# Patient Record
Sex: Female | Born: 1980 | Hispanic: Yes | Marital: Single | Smoking: Never smoker
Health system: Southern US, Community
[De-identification: ages and names within clinical notes are randomized; demographics above are authoritative.]

## PROBLEM LIST (undated history)

## (undated) ENCOUNTER — Inpatient Hospital Stay: Payer: Self-pay

## (undated) DIAGNOSIS — F32A Depression, unspecified: Secondary | ICD-10-CM

## (undated) DIAGNOSIS — F329 Major depressive disorder, single episode, unspecified: Secondary | ICD-10-CM

## (undated) DIAGNOSIS — K8020 Calculus of gallbladder without cholecystitis without obstruction: Secondary | ICD-10-CM

## (undated) DIAGNOSIS — D649 Anemia, unspecified: Secondary | ICD-10-CM

## (undated) DIAGNOSIS — E669 Obesity, unspecified: Secondary | ICD-10-CM

## (undated) HISTORY — DX: Calculus of gallbladder without cholecystitis without obstruction: K80.20

## (undated) HISTORY — DX: Depression, unspecified: F32.A

## (undated) HISTORY — DX: Obesity, unspecified: E66.9

## (undated) HISTORY — DX: Major depressive disorder, single episode, unspecified: F32.9

---

## 2006-01-11 ENCOUNTER — Ambulatory Visit: Payer: Self-pay | Admitting: Family Medicine

## 2006-02-14 ENCOUNTER — Ambulatory Visit: Payer: Self-pay | Admitting: Family Medicine

## 2006-05-29 ENCOUNTER — Ambulatory Visit: Payer: Self-pay | Admitting: Family Medicine

## 2006-07-20 ENCOUNTER — Inpatient Hospital Stay: Payer: Self-pay

## 2010-03-07 ENCOUNTER — Emergency Department: Payer: Self-pay | Admitting: Emergency Medicine

## 2011-03-15 ENCOUNTER — Ambulatory Visit: Payer: Self-pay | Admitting: Advanced Practice Midwife

## 2011-08-20 ENCOUNTER — Inpatient Hospital Stay: Payer: Self-pay | Admitting: Obstetrics and Gynecology

## 2011-09-14 ENCOUNTER — Emergency Department: Payer: Self-pay | Admitting: Emergency Medicine

## 2014-12-19 NOTE — L&D Delivery Note (Signed)
Delivery Note At  a viable female was delivered via  SVD  (Presentation  roa : ;  ).  APGAR:9/9  , ; weight  .  7/14 Placenta status delayed cord clamping and spontaneous placenta delivery : , .     complications: None .   Anesthesia: Epidural  Episiotomy: None Lacerations: None Suture Repair: n/a Est. Blood Loss (mL):  150 cc  Mom to postpartum.  Baby to Couplet care / Skin to Skin  MBT O neg .  Gurshan Settlemire 08/21/2015, 10:19 PM

## 2015-03-19 ENCOUNTER — Ambulatory Visit
Admit: 2015-03-19 | Disposition: A | Discharge status: Home / Self Care | Payer: Self-pay | Attending: Primary Care | Admitting: Primary Care

## 2015-07-08 ENCOUNTER — Observation Stay
Admission: EM | Admit: 2015-07-08 | Discharge: 2015-07-08 | Disposition: A | Discharge status: Home / Self Care | Payer: Self-pay | Attending: Obstetrics and Gynecology | Admitting: Obstetrics and Gynecology

## 2015-07-08 DIAGNOSIS — R319 Hematuria, unspecified: Principal | ICD-10-CM | POA: Insufficient documentation

## 2015-07-08 DIAGNOSIS — Z3A34 34 weeks gestation of pregnancy: Secondary | ICD-10-CM | POA: Insufficient documentation

## 2015-07-08 LAB — URINALYSIS COMPLETE WITH MICROSCOPIC (ARMC ONLY)
Bilirubin Urine: NEGATIVE
GLUCOSE, UA: NEGATIVE mg/dL
Ketones, ur: NEGATIVE mg/dL
Leukocytes, UA: NEGATIVE
NITRITE: NEGATIVE
Protein, ur: 100 mg/dL — AB
Specific Gravity, Urine: 1.004 — ABNORMAL LOW (ref 1.005–1.030)
pH: 7 (ref 5.0–8.0)

## 2015-07-08 LAB — CBC
HCT: 33.4 % — ABNORMAL LOW (ref 35.0–47.0)
HEMOGLOBIN: 11.6 g/dL — AB (ref 12.0–16.0)
MCH: 30.9 pg (ref 26.0–34.0)
MCHC: 34.7 g/dL (ref 32.0–36.0)
MCV: 89.1 fL (ref 80.0–100.0)
PLATELETS: 188 10*3/uL (ref 150–440)
RBC: 3.75 MIL/uL — AB (ref 3.80–5.20)
RDW: 14.1 % (ref 11.5–14.5)
WBC: 8 10*3/uL (ref 3.6–11.0)

## 2015-07-08 NOTE — Discharge Instructions (Signed)
Hematuria (Hematuria) La hematuria es la presencia de sangre en la orina. La causa puede ser una infeccin en la vejiga, en los riones, en la prstata, clculos renales o cncer de las vas urinarias. Normalmente las infecciones pueden tratarse con medicamentos, y los clculos renales, por lo general, se eliminarn a travs de la orina. Si ninguna de estas es la causa de su hematuria, quizs se necesiten ms estudios para Building services engineerdescubrir el motivo. Es muy importante que le informe a su mdico si ve sangre en la [place], aunque el sangrado se detenga sin tratamiento o no cause dolor. La sangre que aparece en la orina y luego se detiene y vuelve a aparecer nuevamente puede ser un sntoma de una enfermedad muy grave. Adems, el dolor no es un sntoma en las etapas iniciales de muchos tipos de cncer urinarios. INSTRUCCIONES PARA EL CUIDADO EN EL HOGAR   Beba mucho lquido, entre 3 a Risk analyst4litros por da. Si le han diagnosticado una infeccin, se recomienda especialmente el jugo de arndanos rojos, [address] de grandes cantidades de [place].  Evite la cafena, el t y las bebidas con gas porque suelen irritar la vejiga.  Evite el alcohol ya que puede Teacher, adult educationirritar la prstata.  Tome todos los Mons[employer] Companymedicamentos como se lo haya indicado el mdico.  Si le recetaron antibiticos, asegrese de terminarlos, incluso si comienza a sentirse mejor.  Si le han diagnosticado clculos renales, siga las instrucciones de su mdico con respecto a Ecologistfiltrar la orina para TEFL teacherrescatar el clculo.  Vace la vejiga con frecuencia. Evite retener la orina durante largos perodos.  Despus de defecar, las mujeres deben higienizarse desde adelante hacia atrs. Use solo un papel por vez.  Si es AmerisourceBergen Corporationuna mujer, vace la vejiga antes y despus de Management consultanttener relaciones sexuales. SOLICITE ATENCIN MDICA SI:  Siente dolor en la espalda.  Tiene fiebre.  Tiene sensacin de Programme researcher, broadcasting/film/videomalestar estomacal (nuseas) o vmitos.  Los sntomas no mejoran despus de [address] das. Si la  condicin empeora, visite al mdico antes de lo previsto. SOLICITE ATENCIN MDICA DE INMEDIATO SI:   Vomita con frecuencia e intensamente y no puede tolerar los medicamentos.  Siente un dolor intenso en la espalda o el abdomen incluso tomando los medicamentos.  Elimina cogulos grandes o sangre con la [place].  Se siente extremadamente dbil, se desmaya o pierde el conocimiento. ASEGRESE DE QUE:   Comprende estas instrucciones.  Controlar su afeccin.  Recibir ayuda de inmediato si no mejora o si empeora. Document Released: 12/05/2005 Document Revised: 04/21/2014 [facility] Patient Information 2015 [employer] [place]. This information is not intended to replace advice given to you by your health care provider. Make sure you discuss any questions you have with your health care provider.

## 2015-07-09 NOTE — Progress Notes (Signed)
S: Resting comfortably. no CTX, VB. Active fetal movement. Concerned about hematuria noted at [facility] today.   O: LMP 11/09/2014 (Exact Date)   Gen: NAD, AAOx3      Abd: FNTTP      Ext: Non-tender, Nonedmeatous    FHT: 130s mod var + accelerations no decelerations TOCO: quiet SVE: closed   A/P:  34 y.o. [id number] [redacted]w[redacted]d with hematuria  Labor: not present.   CBC wnl  Urine culture sent  FWB: Reassuring Cat 1 tracing.  D/c home stable, precautions reviewed, follow-up as scheduled.    Zakar Brosch MICHAEL 6:42 AM

## 2015-08-21 ENCOUNTER — Encounter: Payer: Self-pay | Admitting: *Deleted

## 2015-08-21 ENCOUNTER — Inpatient Hospital Stay: Payer: Medicaid Other | Admitting: Anesthesiology

## 2015-08-21 ENCOUNTER — Inpatient Hospital Stay
Admission: EM | Admit: 2015-08-21 | Discharge: 2015-08-23 | DRG: 767 | Disposition: A | Discharge status: Home / Self Care | Payer: Medicaid Other | Attending: Obstetrics and Gynecology | Admitting: Obstetrics and Gynecology

## 2015-08-21 DIAGNOSIS — Z302 Encounter for sterilization: Secondary | ICD-10-CM

## 2015-08-21 DIAGNOSIS — Z3A40 40 weeks gestation of pregnancy: Secondary | ICD-10-CM | POA: Diagnosis not present

## 2015-08-21 DIAGNOSIS — O480 Post-term pregnancy: Principal | ICD-10-CM | POA: Diagnosis present

## 2015-08-21 LAB — TYPE AND SCREEN
ABO/RH(D): O NEG
ANTIBODY SCREEN: POSITIVE

## 2015-08-21 LAB — CBC
HCT: 37.7 % (ref 35.0–47.0)
Hemoglobin: 12.9 g/dL (ref 12.0–16.0)
MCH: 31.1 pg (ref 26.0–34.0)
MCHC: 34.3 g/dL (ref 32.0–36.0)
MCV: 90.7 fL (ref 80.0–100.0)
PLATELETS: 179 10*3/uL (ref 150–440)
RBC: 4.16 MIL/uL (ref 3.80–5.20)
RDW: 14.3 % (ref 11.5–14.5)
WBC: 8 10*3/uL (ref 3.6–11.0)

## 2015-08-21 LAB — ABO/RH: ABO/RH(D): O NEG

## 2015-08-21 MED ORDER — DINOPROSTONE 10 MG VA INST
10.0000 mg | VAGINAL_INSERT | Freq: Once | VAGINAL | Status: DC
  Filled 2015-08-21: qty 1

## 2015-08-21 MED ORDER — OXYTOCIN 40 UNITS IN LACTATED RINGERS INFUSION - SIMPLE MED
62.5000 mL/h | INTRAVENOUS | Status: DC
  Administered 2015-08-21: 62.5 mL/h via INTRAVENOUS

## 2015-08-21 MED ORDER — OXYCODONE-ACETAMINOPHEN 5-325 MG PO TABS
1.0000 | ORAL_TABLET | ORAL | Status: DC | PRN
  Filled 2015-08-21 (×3): qty 1

## 2015-08-21 MED ORDER — MISOPROSTOL 25 MCG QUARTER TABLET
ORAL_TABLET | ORAL | Status: AC
  Administered 2015-08-21: 25 ug via VAGINAL
  Filled 2015-08-21: qty 0.25

## 2015-08-21 MED ORDER — OXYCODONE-ACETAMINOPHEN 5-325 MG PO TABS
2.0000 | ORAL_TABLET | ORAL | Status: DC | PRN
  Administered 2015-08-22 – 2015-08-23 (×5): 2 via ORAL
  Filled 2015-08-21 (×5): qty 2

## 2015-08-21 MED ORDER — CITRIC ACID-SODIUM CITRATE 334-500 MG/5ML PO SOLN: 30.0000 mL | ORAL | Status: DC | PRN

## 2015-08-21 MED ORDER — BUPIVACAINE HCL (PF) 0.25 % IJ SOLN
INTRAMUSCULAR | Status: DC | PRN
  Administered 2015-08-21: 5 mL

## 2015-08-21 MED ORDER — MISOPROSTOL 25 MCG QUARTER TABLET
25.0000 ug | ORAL_TABLET | ORAL | Status: DC
  Administered 2015-08-21 (×4): 25 ug via VAGINAL
  Filled 2015-08-21 (×2): qty 0.25

## 2015-08-21 MED ORDER — LIDOCAINE-EPINEPHRINE (PF) 1.5 %-1:200000 IJ SOLN
INTRAMUSCULAR | Status: DC | PRN
  Administered 2015-08-21: 2 mL via PERINEURAL

## 2015-08-21 MED ORDER — ONDANSETRON HCL 4 MG/2ML IJ SOLN: 4.0000 mg | Freq: Four times a day (QID) | INTRAMUSCULAR | Status: DC | PRN

## 2015-08-21 MED ORDER — FENTANYL 2.5 MCG/ML W/ROPIVACAINE 0.2% IN NS 100 ML EPIDURAL INFUSION (ARMC-ANES)
EPIDURAL | Status: AC
  Administered 2015-08-21: 9 mL/h via EPIDURAL
  Filled 2015-08-21: qty 100

## 2015-08-21 MED ORDER — ACETAMINOPHEN 325 MG PO TABS: 650.0000 mg | ORAL_TABLET | ORAL | Status: DC | PRN

## 2015-08-21 MED ORDER — LIDOCAINE HCL (PF) 1 % IJ SOLN: 30.0000 mL | INTRAMUSCULAR | Status: DC | PRN

## 2015-08-21 MED ORDER — FLEET ENEMA 7-19 GM/118ML RE ENEM: 1.0000 | ENEMA | Freq: Every day | RECTAL | Status: DC | PRN

## 2015-08-21 MED ORDER — OXYTOCIN BOLUS FROM INFUSION: 500.0000 mL | INTRAVENOUS | Status: DC

## 2015-08-21 MED ORDER — LACTATED RINGERS IV SOLN: 500.0000 mL | INTRAVENOUS | Status: DC | PRN

## 2015-08-21 MED ORDER — TERBUTALINE SULFATE 1 MG/ML IJ SOLN: 0.2500 mg | Freq: Once | INTRAMUSCULAR | Status: DC | PRN

## 2015-08-21 MED ORDER — FENTANYL CITRATE (PF) 1000 MCG/20ML IJ SOLN: 20.0000 ug/h | INTRAMUSCULAR | Status: DC

## 2015-08-21 MED ORDER — LACTATED RINGERS IV SOLN
INTRAVENOUS | Status: DC
  Administered 2015-08-21 (×2): via INTRAVENOUS
  Administered 2015-08-22: 1000 mL via INTRAVENOUS

## 2015-08-21 NOTE — Anesthesia Procedure Notes (Signed)
Epidural Patient location during procedure: OB Start time: 08/21/2015 8:24 PM End time: 08/21/2015 8:48 PM  Staffing Anesthesiologist: Elijio Miles F Performed by: anesthesiologist   Preanesthetic Checklist Completed: patient identified, site marked, surgical consent, pre-op evaluation, timeout performed, IV checked, risks and benefits discussed and monitors and equipment checked  Epidural Patient position: sitting Prep: Betadine Patient monitoring: heart rate and blood pressure Approach: midline Location: L3-L4 Injection technique: LOR air and LOR saline  Needle:  Needle type: Tuohy  Needle gauge: 18 G Needle length: 9 cm Needle insertion depth: 5 cm Catheter type: closed end flexible Catheter size: 20 Guage Catheter at skin depth: 9 cm Test dose: negative and 2% lidocaine with Epi 1:200 K  Assessment Sensory level: T8

## 2015-08-21 NOTE — H&P (Signed)
Tammy Herrera is a 34 y.o. female G4P3 presenting for IOL at 40+5weeks for postdates.  Denies HA, visual disturbances, epigastric pain, VB, LOF, ctxs, abnormal DC Maternal Medical History:  Fetal activity: Perceived fetal activity is normal.   Last perceived fetal movement was within the past hour.    Prenatal complications: no prenatal complications Prenatal Complications - Diabetes: none.    OB History    Gravida Para Term Preterm AB TAB SAB Ectopic Multiple Living   History reviewed. Hx: seasonal allergies, obesity, RH negative  History reviewed. No pertinent past surgical history. Family History: family history is not on file. Social History:  reports that she has never smoked. She does not have any smokeless tobacco history on file. She reports that she does not drink alcohol or use illicit drugs.   Prenatal Transfer Tool  Maternal Diabetes: No Genetic Screening: Declined Maternal Ultrasounds/Referrals: Normal Fetal Ultrasounds or other Referrals:  None Maternal Substance Abuse:  No Significant Maternal Medications:  None Significant Maternal Lab Results:  None Other Comments:  None  Review of Systems  Constitutional: Negative for fever, chills and weight loss.  HENT: Negative.   Eyes: Negative for blurred vision and photophobia.  Respiratory: Negative for cough, shortness of breath and wheezing.   Cardiovascular: Negative for chest pain and palpitations.  Gastrointestinal: Negative for heartburn, vomiting, abdominal pain and diarrhea.  Genitourinary: Negative for dysuria, urgency and frequency.  Musculoskeletal: Negative.   Skin: Negative.   Neurological: Negative for dizziness and seizures.  Psychiatric/Behavioral: The patient is nervous/anxious.     Dilation: Fingertip Effacement (%): 20 Station: -3 Exam by:: Tammy Herrera, CNM Last menstrual period 11/09/2014. Maternal Exam:  Abdomen: Patient reports no abdominal tenderness. Fetal  presentation: vertex  Introitus: Normal vulva. Normal vagina.  Pelvis: adequate for delivery.   Cervix: Cervix evaluated by digital exam.     Physical Exam  Constitutional: She is oriented to person, place, and time. She appears well-developed and well-nourished.  HENT:  Head: Normocephalic.  Eyes: Pupils are equal, round, and reactive to light.  Cardiovascular: Normal rate, regular rhythm and normal heart sounds.   Respiratory: Breath sounds normal.  GI: Soft. Bowel sounds are normal. There is no CVA tenderness.  Genitourinary: Vagina normal and uterus normal.  Gravid  Neurological: She is alert and oriented to person, place, and time.  Skin: Skin is dry.  Psychiatric: She has a normal mood and affect. Her behavior is normal. Judgment and thought content normal.  Bishop Score: 2  Fetal Monitoring: Baseline:  135bmp; Moderate variability, +accels, no decels Toco: irregular ctxs.  Prenatal labs: ABO, Rh:  Negative  Antibody:  Negative Rubella:  Immune RPR:   NR HBsAg:   Negative HIV:   NR GBS:   Negative  Assessment/Plan: IOL at 40+5weeks for postdates RH Negative - will need Rhogam PP Cervadil  vaginally x 1 Bilateral Tubal Ligation Postpartum - consent signed Reassess in 1-2 hours Category 1 fetal heart tracing  Anticipate NSVD  Discussed plan of care with Dr. Feliberto Gottron and he agrees.   Karena Addison CNM 08/21/2015, 8:12 AM

## 2015-08-21 NOTE — Progress Notes (Signed)
Patient ID: Tammy Herrera, female   DOB: 08-19-1981, 34 y.o.   MRN: [id number] cx 5 cm . Ctx q2 minutes . Reassuring fetal monitoring . ClE in place .

## 2015-08-21 NOTE — Progress Notes (Signed)
Patient ID: Tammy Herrera, female   DOB: [birth year], 34 y.o.   MRN: [id number] Pt is s/p 3 doses of cytotec .  Reassuring fetal monitoring  Ctx 5-7 minutes  Cx : 2 cm / 50% / -1 VTX . Some old blood  A: induction with cervical progression  P: repeat cytotec at 1700

## 2015-08-21 NOTE — Anesthesia Preprocedure Evaluation (Signed)
Anesthesia Evaluation  Patient identified by MRN, date of birth, ID band Patient awake    Reviewed: Allergy & Precautions, NPO status , Patient's Chart, lab work & pertinent test results  Airway Mallampati: II       Dental no notable dental hx.    Pulmonary neg pulmonary ROS,    Pulmonary exam normal       Cardiovascular negative cardio ROS Normal cardiovascular exam    Neuro/Psych negative neurological ROS     GI/Hepatic negative GI ROS, Neg liver ROS,   Endo/Other  negative endocrine ROS  Renal/GU negative Renal ROS     Musculoskeletal negative musculoskeletal ROS (+)   Abdominal Normal abdominal exam  (+)   Peds  Hematology negative hematology ROS (+)   Anesthesia Other Findings   Reproductive/Obstetrics negative OB ROS                             Anesthesia Physical Anesthesia Plan  ASA: II  Anesthesia Plan: Epidural   Post-op Pain Management:    Induction:   Airway Management Planned: Natural Airway  Additional Equipment:   Intra-op Plan:   Post-operative Plan:   Informed Consent: I have reviewed the patients History and Physical, chart, labs and discussed the procedure including the risks, benefits and alternatives for the proposed anesthesia with the patient or authorized representative who has indicated his/her understanding and acceptance.     Plan Discussed with:   Anesthesia Plan Comments:         Anesthesia Quick Evaluation

## 2015-08-21 NOTE — Progress Notes (Signed)
Patient ID: Tammy Herrera, female   DOB: 08/11/1981, 34 y.o.   MRN: [id number] 25 mcg cytotec placed . CX 1-2 cm / 50 /-3 VTX  Translator present  All questions answered

## 2015-08-21 NOTE — Progress Notes (Signed)
Patient ID: Tammy Herrera, female   DOB: [birth year], 34 y.o.   MRN: [id number] Induction from [facility] HD for "postdates" 40+5 weeks. GBS neg . Assuming care . Will plce cytotec shortly  Reassuring fetal monitoring

## 2015-08-21 NOTE — Progress Notes (Signed)
Patient ID: Tammy Herrera, female   DOB: 1981/10/11, 34 y.o.   MRN: [id number] cx 3 cm , arom blood tinged .  IUPC and fse placed  Reassuring fetal monitoring  Anticipate svd .

## 2015-08-22 ENCOUNTER — Inpatient Hospital Stay: Payer: Medicaid Other | Admitting: Registered Nurse

## 2015-08-22 ENCOUNTER — Encounter: Payer: Self-pay | Admitting: Anesthesiology

## 2015-08-22 ENCOUNTER — Encounter
Admission: EM | Disposition: A | Discharge status: Home / Self Care | Payer: Self-pay | Source: Home / Self Care | Attending: Obstetrics and Gynecology

## 2015-08-22 HISTORY — PX: TUBAL LIGATION: SHX77

## 2015-08-22 LAB — CBC
HEMATOCRIT: 34.2 % — AB (ref 35.0–47.0)
Hemoglobin: 12.3 g/dL (ref 12.0–16.0)
MCH: 32.4 pg (ref 26.0–34.0)
MCHC: 36 g/dL (ref 32.0–36.0)
MCV: 90 fL (ref 80.0–100.0)
Platelets: 178 10*3/uL (ref 150–440)
RBC: 3.8 MIL/uL (ref 3.80–5.20)
RDW: 14.2 % (ref 11.5–14.5)
WBC: 10.6 10*3/uL (ref 3.6–11.0)

## 2015-08-22 LAB — RPR: RPR: NONREACTIVE

## 2015-08-22 SURGERY — LIGATION, FALLOPIAN TUBE, POSTPARTUM
Anesthesia: General | Laterality: Bilateral

## 2015-08-22 MED ORDER — PROPOFOL 10 MG/ML IV BOLUS
INTRAVENOUS | Status: DC | PRN
  Administered 2015-08-22: 200 mg via INTRAVENOUS

## 2015-08-22 MED ORDER — BUPIVACAINE HCL 0.5 % IJ SOLN
INTRAMUSCULAR | Status: DC | PRN
Start: 2015-08-22 — End: 2015-08-22
  Administered 2015-08-22: 2 mL

## 2015-08-22 MED ORDER — FENTANYL CITRATE (PF) 100 MCG/2ML IJ SOLN
25.0000 ug | INTRAMUSCULAR | Status: DC | PRN
  Administered 2015-08-22 (×4): 25 ug via INTRAVENOUS

## 2015-08-22 MED ORDER — BUPIVACAINE HCL (PF) 0.5 % IJ SOLN
INTRAMUSCULAR | Status: AC
  Filled 2015-08-22: qty 30

## 2015-08-22 MED ORDER — IBUPROFEN 600 MG PO TABS
600.0000 mg | ORAL_TABLET | Freq: Four times a day (QID) | ORAL | Status: DC
  Administered 2015-08-22 – 2015-08-23 (×4): 600 mg via ORAL
  Filled 2015-08-22 (×6): qty 1

## 2015-08-22 MED ORDER — NEOSTIGMINE METHYLSULFATE 10 MG/10ML IV SOLN
INTRAVENOUS | Status: DC | PRN
  Administered 2015-08-22: 3 mg via INTRAVENOUS

## 2015-08-22 MED ORDER — ROCURONIUM BROMIDE 100 MG/10ML IV SOLN
INTRAVENOUS | Status: DC | PRN
  Administered 2015-08-22: 20 mg via INTRAVENOUS

## 2015-08-22 MED ORDER — OXYTOCIN 40 UNITS IN LACTATED RINGERS INFUSION - SIMPLE MED: 62.5000 mL/h | INTRAVENOUS | Status: DC | PRN

## 2015-08-22 MED ORDER — SENNOSIDES-DOCUSATE SODIUM 8.6-50 MG PO TABS
2.0000 | ORAL_TABLET | ORAL | Status: DC
  Administered 2015-08-22 – 2015-08-23 (×2): 2 via ORAL
  Filled 2015-08-22 (×2): qty 2

## 2015-08-22 MED ORDER — DIPHENHYDRAMINE HCL 25 MG PO CAPS: 25.0000 mg | ORAL_CAPSULE | Freq: Four times a day (QID) | ORAL | Status: DC | PRN

## 2015-08-22 MED ORDER — ONDANSETRON HCL 4 MG PO TABS: 4.0000 mg | ORAL_TABLET | ORAL | Status: DC | PRN

## 2015-08-22 MED ORDER — LIDOCAINE HCL (CARDIAC) 20 MG/ML IV SOLN
INTRAVENOUS | Status: DC | PRN
  Administered 2015-08-22: 100 mg via INTRAVENOUS

## 2015-08-22 MED ORDER — LANOLIN HYDROUS EX OINT: TOPICAL_OINTMENT | CUTANEOUS | Status: DC | PRN

## 2015-08-22 MED ORDER — PRENATAL MULTIVITAMIN CH
1.0000 | ORAL_TABLET | Freq: Every day | ORAL | Status: DC
  Administered 2015-08-22 – 2015-08-23 (×2): 1 via ORAL
  Filled 2015-08-22 (×2): qty 1

## 2015-08-22 MED ORDER — OXYCODONE-ACETAMINOPHEN 5-325 MG PO TABS
1.0000 | ORAL_TABLET | ORAL | Status: DC | PRN
  Administered 2015-08-22 (×2): 1 via ORAL

## 2015-08-22 MED ORDER — LACTATED RINGERS IV SOLN
INTRAVENOUS | Status: DC | PRN
  Administered 2015-08-22: 09:00:00 via INTRAVENOUS

## 2015-08-22 MED ORDER — SUCCINYLCHOLINE CHLORIDE 20 MG/ML IJ SOLN
INTRAMUSCULAR | Status: DC | PRN
  Administered 2015-08-22: 100 mg via INTRAVENOUS

## 2015-08-22 MED ORDER — FERROUS SULFATE 325 (65 FE) MG PO TABS
325.0000 mg | ORAL_TABLET | Freq: Two times a day (BID) | ORAL | Status: DC
  Administered 2015-08-22 – 2015-08-23 (×2): 325 mg via ORAL
  Filled 2015-08-22 (×2): qty 1

## 2015-08-22 MED ORDER — ACETAMINOPHEN 325 MG PO TABS: 650.0000 mg | ORAL_TABLET | ORAL | Status: DC | PRN

## 2015-08-22 MED ORDER — ZOLPIDEM TARTRATE 5 MG PO TABS: 5.0000 mg | ORAL_TABLET | Freq: Every evening | ORAL | Status: DC | PRN

## 2015-08-22 MED ORDER — BENZOCAINE-MENTHOL 20-0.5 % EX AERO: 1.0000 "application " | INHALATION_SPRAY | CUTANEOUS | Status: DC | PRN

## 2015-08-22 MED ORDER — ONDANSETRON HCL 4 MG/2ML IJ SOLN: 4.0000 mg | INTRAMUSCULAR | Status: DC | PRN

## 2015-08-22 MED ORDER — RHO D IMMUNE GLOBULIN 1500 UNIT/2ML IJ SOSY
300.0000 ug | PREFILLED_SYRINGE | Freq: Once | INTRAMUSCULAR | Status: DC
  Filled 2015-08-22: qty 2

## 2015-08-22 MED ORDER — MAGNESIUM HYDROXIDE 400 MG/5ML PO SUSP: 30.0000 mL | ORAL | Status: DC | PRN

## 2015-08-22 MED ORDER — ONDANSETRON HCL 4 MG/2ML IJ SOLN
INTRAMUSCULAR | Status: DC | PRN
  Administered 2015-08-22: 4 mg via INTRAVENOUS

## 2015-08-22 MED ORDER — MEASLES, MUMPS & RUBELLA VAC ~~LOC~~ INJ
0.5000 mL | INJECTION | Freq: Once | SUBCUTANEOUS | Status: AC
  Administered 2015-08-23: 0.5 mL via SUBCUTANEOUS
  Filled 2015-08-22: qty 0.5

## 2015-08-22 MED ORDER — WITCH HAZEL-GLYCERIN EX PADS: 1.0000 "application " | MEDICATED_PAD | CUTANEOUS | Status: DC | PRN

## 2015-08-22 MED ORDER — DIBUCAINE 1 % RE OINT: 1.0000 "application " | TOPICAL_OINTMENT | RECTAL | Status: DC | PRN

## 2015-08-22 MED ORDER — GLYCOPYRROLATE 0.2 MG/ML IJ SOLN
INTRAMUSCULAR | Status: DC | PRN
  Administered 2015-08-22: 0.6 mg via INTRAVENOUS

## 2015-08-22 MED ORDER — SIMETHICONE 80 MG PO CHEW: 80.0000 mg | CHEWABLE_TABLET | ORAL | Status: DC | PRN

## 2015-08-22 MED ORDER — PROMETHAZINE HCL 25 MG/ML IJ SOLN: 6.2500 mg | INTRAMUSCULAR | Status: DC | PRN

## 2015-08-22 MED ORDER — FENTANYL CITRATE (PF) 100 MCG/2ML IJ SOLN
INTRAMUSCULAR | Status: AC
  Filled 2015-08-22: qty 2

## 2015-08-22 SURGICAL SUPPLY — 28 items
APPLICATOR COTTON TIP 6IN STRL (MISCELLANEOUS) IMPLANT
BLADE SURG SZ11 CARB STEEL (BLADE) ×2 IMPLANT
CANISTER SUCT 1200ML W/VALVE (MISCELLANEOUS) ×2 IMPLANT
CHLORAPREP W/TINT 26ML (MISCELLANEOUS) ×2 IMPLANT
DRAPE LAPAROTOMY 100X77 ABD (DRAPES) ×2 IMPLANT
DRSG TEGADERM 2-3/8X2-3/4 SM (GAUZE/BANDAGES/DRESSINGS) IMPLANT
DRSG TEGADERM 4X4.75 (GAUZE/BANDAGES/DRESSINGS) ×2 IMPLANT
GAUZE SPONGE NON-WVN 2X2 STRL (MISCELLANEOUS) ×1 IMPLANT
GLOVE BIO SURGEON STRL SZ8 (GLOVE) ×6 IMPLANT
GOWN STRL REUS W/ TWL LRG LVL3 (GOWN DISPOSABLE) ×1 IMPLANT
GOWN STRL REUS W/ TWL XL LVL3 (GOWN DISPOSABLE) ×1 IMPLANT
GOWN STRL REUS W/TWL LRG LVL3 (GOWN DISPOSABLE) ×1
GOWN STRL REUS W/TWL XL LVL3 (GOWN DISPOSABLE) ×1
KIT RM TURNOVER STRD PROC AR (KITS) ×2 IMPLANT
LABEL OR SOLS (LABEL) ×2 IMPLANT
LIQUID BAND (GAUZE/BANDAGES/DRESSINGS) ×2 IMPLANT
NEEDLE HYPO 25X1 1.5 SAFETY (NEEDLE) ×2 IMPLANT
NS IRRIG 500ML POUR BTL (IV SOLUTION) ×2 IMPLANT
PACK BASIN MINOR ARMC (MISCELLANEOUS) ×2 IMPLANT
PAD GROUND ADULT SPLIT (MISCELLANEOUS) ×2 IMPLANT
SPONGE VERSALON 2X2 STRL (MISCELLANEOUS) ×1
STRIP CLOSURE SKIN 1/4X4 (GAUZE/BANDAGES/DRESSINGS) ×2 IMPLANT
SUT PLAIN GUT 0 (SUTURE) ×2 IMPLANT
SUT VIC AB 2-0 UR6 27 (SUTURE) ×2 IMPLANT
SUT VIC AB 4-0 SH 27 (SUTURE) ×1
SUT VIC AB 4-0 SH 27XANBCTRL (SUTURE) ×1 IMPLANT
SWABSTK COMLB BENZOIN TINCTURE (MISCELLANEOUS) ×2 IMPLANT
SYRINGE 10CC LL (SYRINGE) ×2 IMPLANT

## 2015-08-22 NOTE — Anesthesia Postprocedure Evaluation (Signed)
  Anesthesia Post-op Note  Patient: Tammy Herrera  Procedure(s) Performed: Procedure(s): POST PARTUM TUBAL LIGATION (Bilateral)  Anesthesia type:General, Rapid Sequence  Patient location: PACU  Post pain: Pain level controlled  Post assessment: Post-op Vital signs reviewed, Patient's Cardiovascular Status Stable, Respiratory Function Stable, Patent Airway and No signs of Nausea or vomiting  Post vital signs: Reviewed and stable  Last Vitals:  Filed Vitals:   08/22/15 1113  BP: 101/58  Pulse: 54  Temp: 36.6 C  Resp: 18    Level of consciousness: awake, alert  and patient cooperative  Complications: No apparent anesthesia complications

## 2015-08-22 NOTE — Anesthesia Procedure Notes (Signed)
Procedure Name: Intubation Date/Time: 08/22/2015 9:08 AM Performed by: NFAOZHY, Amarilys Lyles Pre-anesthesia Checklist: Timeout performed, Patient being monitored, Suction available, Emergency Drugs available and Patient identified Patient Re-evaluated:Patient Re-evaluated prior to inductionOxygen Delivery Method: Circle system utilized Intubation Type: Rapid sequence and IV induction Laryngoscope Size: Mac and 3 Tube type: Oral Number of attempts: 1 Secured at: 21 cm Tube secured with: Tape

## 2015-08-22 NOTE — Anesthesia Preprocedure Evaluation (Signed)
Anesthesia Evaluation  Patient identified by MRN, date of birth, ID band Patient awake    Reviewed: Allergy & Precautions, H&P , NPO status , Patient's Chart, lab work & pertinent test results, reviewed documented beta blocker date and time   History of Anesthesia Complications Negative for: history of anesthetic complications  Airway Mallampati: II  TM Distance: >3 FB Neck ROM: full    Dental no notable dental hx. (+) Teeth Intact   Pulmonary neg shortness of breath, neg sleep apnea, neg COPDneg recent URI, former smoker,  breath sounds clear to auscultation  Pulmonary exam normal       Cardiovascular Exercise Tolerance: Good negative cardio ROS Normal cardiovascular examRhythm:regular Rate:Normal     Neuro/Psych negative neurological ROS  negative psych ROS   GI/Hepatic Neg liver ROS, GERD-  ,  Endo/Other  negative endocrine ROS  Renal/GU negative Renal ROS  negative genitourinary   Musculoskeletal   Abdominal   Peds  Hematology negative hematology ROS (+)   Anesthesia Other Findings History reviewed. No pertinent past medical history.   Reproductive/Obstetrics (+) Breast feeding                              Anesthesia Physical Anesthesia Plan  ASA: II  Anesthesia Plan: General and Rapid Sequence   Post-op Pain Management:    Induction:   Airway Management Planned:   Additional Equipment:   Intra-op Plan:   Post-operative Plan:   Informed Consent: I have reviewed the patients History and Physical, chart, labs and discussed the procedure including the risks, benefits and alternatives for the proposed anesthesia with the patient or authorized representative who has indicated his/her understanding and acceptance.   Dental Advisory Given  Plan Discussed with: Anesthesiologist, CRNA and Surgeon  Anesthesia Plan Comments:         Anesthesia Quick Evaluation

## 2015-08-22 NOTE — Anesthesia Postprocedure Evaluation (Signed)
  Anesthesia Post-op Note  Patient: Tammy Herrera  Procedure(s) Performed: * No procedures listed *  Anesthesia type:Epidural  Patient location: PACU  Post pain: Pain level controlled  Post assessment: Post-op Vital signs reviewed, Patient's Cardiovascular Status Stable, Respiratory Function Stable, Patent Airway and No signs of Nausea or vomiting  Post vital signs: Reviewed and stable  Last Vitals:  Filed Vitals:   08/22/15 1113  BP: 101/58  Pulse: 54  Temp: 36.6 C  Resp: 18    Level of consciousness: awake, alert  and patient cooperative  Complications: No apparent anesthesia complications

## 2015-08-22 NOTE — Progress Notes (Signed)
Patient ID: Tammy Herrera, female   DOB: 02/21/1981, 34 y.o.   MRN: [id number] Brief op note :  Preop : elective steriilzation , PP Post op : same  Procedure Pomeroy PP BTL Anesthesia: GETA Surgeon : Devyn Sheerin Complications NONE  IOF 800 cc EBL : 5 cc To RR in good condition

## 2015-08-22 NOTE — Progress Notes (Signed)
Post Partum Day 1 Subjective: no complaints  Objective: Blood pressure 107/51, pulse 60, temperature 97.9 F (36.6 C), temperature source Oral, resp. rate 18, height  (1.549 m), weight 99.791 kg (220 lb), last menstrual period 11/09/2014, SpO2 99 %, unknown if currently breastfeeding.  Physical Exam:  General: alert and cooperative Lochia: appropriate Uterine Fundus: firm Incision: n/a DVT Evaluation: No evidence of DVT seen on physical exam.   Recent Labs  08/21/15 0838 08/22/15 0516  HGB 12.9 12.3  HCT 37.7 34.2*    Assessment/Plan: Contraception pt ready for PP BTL , recounseled today for the risks of the procedure . All questions answeed with the translator. Possible pm d/c if baby is claered by Peds    LOS: 1 day   Darrin Koman 08/22/2015, 8:31 AM

## 2015-08-22 NOTE — Transfer of Care (Signed)
Immediate Anesthesia Transfer of Care Note  Patient: Tammy Herrera  Procedure(s) Performed: Procedure(s): POST PARTUM TUBAL LIGATION (Bilateral)  Patient Location: PACU  Anesthesia Type:General  Level of Consciousness: awake and alert   Airway & Oxygen Therapy: Patient Spontanous Breathing  Post-op Assessment: Report given to RN  Post vital signs: stable  Last Vitals:  Filed Vitals:   08/22/15 1003  BP: 111/56  Pulse: 64  Temp: 36.5 C  Resp: 16    Complications: No apparent anesthesia complications

## 2015-08-22 NOTE — Op Note (Signed)
NAME:  Tammy Herrera, Tammy Herrera        ACCOUNT NO.:  [id number]  MEDICAL RECORD NO.:  [id number]  LOCATION:  337A                         FACILITY:  [facility]  PHYSICIAN:  Jennell Corner, MDDATE OF BIRTH:  Mar 22, 1981  DATE OF PROCEDURE: DATE OF DISCHARGE:                              OPERATIVE REPORT   PREOPERATIVE DIAGNOSIS:  Elective sterilization.  POSTOPERATIVE DIAGNOSIS:  Elective sterilization.  PROCEDURE:  Postpartum bilateral tubal ligation, Pomeroy.  SURGEON:  Jennell Corner, MD.  ANESTHESIA:  General endotracheal anesthesia.  COMPLICATIONS:  There were no complications.  ESTIMATED BLOOD LOSS:  5 mL.  INTRAOPERATIVE FLUIDS:  800 mL.  INDICATION:  This is a 34 year old female, status post uncomplicated spontaneous vaginal delivery who has previously decided on elective permanent sterilization.  The patient has been consented both in the office and again by me on the day of the procedure.  The patient confirms that she wishes to proceed on with permanent sterilization. The patient is aware of the potential risks, including failure from sterilization of 1/200 per year.  DESCRIPTION OF PROCEDURE:  After adequate general endotracheal anesthesia, the patient was prepped and draped in normal sterile fashion.  A 15-mm infraumbilical incision was made after injected with 0.5% Marcaine.  The fascia was opened sharply as well as the peritoneum. Attention was directed to the patient's right fallopian tube which was grasped with a Babcock clamp, and the fimbriated end was visualized. Two separate 0 plain gut sutures were applied to the midportion of the fallopian tube, and a 1.5 cm portion of fallopian tube was removed.  A good hemostasis was noted.  Attention was directed to the patient's left fallopian tube which was grasped with a Babcock clamp; and again after visualizing the fimbriated end, two separate 0 plain gut sutures were applied at the midportion of the  fallopian tube, and a 1.5 cm portion of fallopian tube was removed.  Both portions of fallopian tube will be sent to Pathology for identification.  Good hemostasis was noted.  The fascia was then closed with a running 2-0 Vicryl suture, and the skin was reapproximated with interrupted 4-0 Vicryl suture.  Liquid band was applied to the skin, and a Tegaderm was applied.  DISPOSITION:  The patient tolerated the procedure well, was taken to the recovery room in good condition.          ______________________________ Jennell Corner, MD     TS/MEDQ  D:  08/22/2015  T:  08/22/2015  Job:  [id number]

## 2015-08-23 MED ORDER — OXYCODONE-ACETAMINOPHEN 5-325 MG PO TABS: 1.0000 | ORAL_TABLET | ORAL | Status: DC | PRN

## 2015-08-23 MED ORDER — IBUPROFEN 600 MG PO TABS: 600.0000 mg | ORAL_TABLET | Freq: Four times a day (QID) | ORAL | Status: DC

## 2015-08-23 MED ORDER — TETANUS-DIPHTH-ACELL PERTUSSIS 5-2.5-18.5 LF-MCG/0.5 IM SUSP
0.5000 mL | Freq: Once | INTRAMUSCULAR | Status: AC
  Administered 2015-08-23: 0.5 mL via INTRAMUSCULAR
  Filled 2015-08-23: qty 0.5

## 2015-08-23 NOTE — Discharge Summary (Signed)
Obstetric Discharge Summary Reason for Admission: induction of labor, post dates Prenatal Procedures: none Intrapartum Procedures: spontaneous vaginal delivery Postpartum Procedures: P.P. tubal ligation Complications-Operative and Postpartum: none HEMOGLOBIN  Date Value Ref Range Status  08/22/2015 12.3 12.0 - 16.0 g/dL Final   HCT  Date Value Ref Range Status  08/22/2015 34.2* 35.0 - 47.0 % Final    Physical Exam:  General: alert and cooperative Lochia: appropriate Uterine Fundus: firm Incision: healing well DVT Evaluation: No evidence of DVT seen on physical exam.  Discharge Diagnoses: Term Pregnancy-delivered  Discharge Information: Date: 08/23/2015 Activity: pelvic rest Diet: routine Medications: Ibuprofen and Percocet Condition: stable Instructions: refer to practice specific booklet Discharge to: home   Newborn Data: Live born female  Birth Weight: 7 lb 14.3 oz (3580 g) APGAR: 9, 9  Home with mother.  Tammy Herrera 08/23/2015, 11:37 AM

## 2015-08-23 NOTE — Progress Notes (Signed)
Discharge instructions provided.  Pt verbalizes understanding of all instructions and follow-up care via interpreter.  Prescriptions given.  Pt discharged to home with infant at 1730 on 08/23/15 via wheelchair by RN. Reynold Bowen, RN 08/23/2015 7:06 PM

## 2015-08-23 NOTE — Discharge Instructions (Signed)
Parto vaginal, Cuidados posteriores  °(Vaginal Delivery, Care After) °Siga estas instrucciones durante las próximas semanas. Estas indicaciones para el alta le proporcionan información general acerca de cómo deberá cuidarse después del parto. El médico también podrá darle instrucciones específicas. El tratamiento ha sido planificado según las prácticas médicas actuales, pero en algunos casos pueden ocurrir problemas. Comuníquese con el médico si tiene algún problema o tiene preguntas al volver a su casa.  °INSTRUCCIONES PARA EL CUIDADO EN EL HOGAR  °· Tome sólo medicamentos de venta libre o recetados, según las indicaciones del médico o del farmacéutico. °· No beba alcohol, especialmente si está amamantando o toma analgésicos. °· No mastique tabaco ni fume. °· No consuma drogas. °· Continúe con un adecuado cuidado perineal. El buen cuidado perineal incluye: °· Higienizarse de adelante hacia atrás. °· Mantener la zona perineal limpia. °· No use tampones ni duchas vaginales hasta que su médico la autorice. °· Dúchese, lávese el cabello y tome baños de inmersión según las indicaciones de su médico. °· Utilice un sostén que le ajuste bien y que brinde buen soporte a sus mamas. °· Consuma alimentos saludables. °· Beba suficiente líquido para mantener la orina clara o de color amarillo pálido. °· Consuma alimentos ricos en fibra como cereales y panes integrales, arroz, frijoles y frutas y verduras frescas todos los días. Estos alimentos pueden ayudarla a prevenir o aliviar el estreñimiento. °· Siga las recomendaciones de su médico relacionadas con la reanudación de actividades como subir escaleras, conducir automóviles, levantar objetos, hacer ejercicios o viajar. °· Hable con su médico acerca de reanudar la actividad sexual. Volver a la actividad sexual depende del riesgo de infección, la velocidad de la curación y la comodidad y su deseo de reanudarla. °· Trate de que alguien la ayude con las actividades del hogar y con  el recién nacido al menos durante un par de días después de salir del hospital. °· Descanse todo lo que pueda. Trate de descansar o tomar una siesta mientras el bebé está durmiendo. °· Aumente sus actividades gradualmente. °· Cumpla con todas las visitas de control programadas para después del parto. Es muy importante asistir a todas las citas programadas de seguimiento. En estas citas, su médico va a controlarla para asegurarse de que esté sanando física y emocionalmente. °SOLICITE ATENCIÓN MÉDICA SI:  °· Elimina coágulos grandes por la vagina. Guarde algunos coágulos para mostrarle al médico. °· Tiene una secreción con feo olor que proviene de la vagina. °· Tiene dificultad para orinar. °· Orina con frecuencia. °· Siente dolor al orinar. °· Nota un cambio en sus movimientos intestinales. °· Aumenta el enrojecimiento, el dolor o la hinchazón en la zona de la incisión vaginal (episiotomía) o el desgarro vaginal. °· Tiene pus que drena por la episiotomía o el desgarro vaginal. °· La episiotomía o el desgarro vaginal se abren. °· Sus mamas le duelen, están duras o enrojecidas. °· Sufre un dolor intenso de cabeza. °· Tiene visión borrosa o ve manchas. °· Se siente triste o deprimida. °· Tiene pensamientos acerca de lastimarse o dañar al recién nacido. °· Tiene preguntas acerca de su cuidado personal, el cuidado del recién nacido o acerca de los medicamentos. °· Se siente mareada o sufre un desmayo. °· Tiene una erupción. °· Tiene náuseas o vómitos. °· Usted amamantó al bebé y no ha tenido su período menstrual dentro de las 12 semanas después de dejar de amamantar. °· No amamanta al bebé y no tuvo su período menstrual en las últimas 12° semanas después del   partoLance Muss. SOLICITE ATENCIN MDICA DE INMEDIATO SI:   Siente dolor persistente.  Siente dolor en el pecho.  Le falta el aire.  Se desmaya.  Siente dolor en la pierna.  Siente Physiological scientist.  El sangrado vaginal satura dos o ms  apsitos en 1 hora. ASEGRESE DE QUE:   Comprende estas instrucciones.  Controlar su enfermedad.  Recibir ayuda de inmediato si no mejora o si empeora. Document Released: 12/05/2005 Document Revised: 08/07/2013 [facility] Patient Information 2015 [place], [place]. This information is not intended to replace advice given to you by your health care provider. Make sure you discuss any questions you have with your health care provider.  Ligadura de trompas con laparoscopa - Cuidados posteriores  (Laparoscopic Tubal Ligation, Care After) Siga estas instrucciones durante las prximas semanas. Estas indicaciones le proporcionan informacin general acerca de cmo deber cuidarse despus del procedimiento. El mdico tambin podr darle instrucciones ms especficas. El tratamiento ha sido planificado segn las prcticas mdicas actuales, pero en algunos casos pueden ocurrir problemas. Comunquese con el mdico si tiene algn problema o tiene preguntas despus del procedimiento.  INSTRUCCIONES PARA EL CUIDADO EN EL HOGAR   Haga reposo por el resto del da.  Slo tome medicamentos de venta libre o recetados para Primary school teacher, las molestias o bajar la fiebre segn las indicaciones de su mdico. No tome aspirina. Puede ocasionar hemorragias.  Podr reanudar gradualmente sus actividades diarias, la dieta, el descanso, conducir vehculos y Printmaker.  Evite las actividades extenuantes durante 2 semanas, o segn lo que le hayan indicado.  No conduzca automviles luego de tomar analgsicos.  No levante objetos que pesen ms de 5 libras ( 2,5 kg) durante 2 semanas, o segn las indicaciones.  Solo tome duchas, evite los baos, hasta que el mdico vuelva a verla.  Cambie el vendaje tal como se le indic.  Tmese la [employer] veces por da y Engineering geologist.  Trate de conseguir ayuda para las tareas [employer] durante 7 a -[address].  Concurra a una visita con el mdico para que le retiren los puntos  (suturas) y para Aeronautical engineer. SOLICITE ATENCIN MDICA SI:   Presenta enrojecimiento, hinchazn o aumento del dolor en la herida.  Hay una secrecin en la herida que dura ms de Civil engineer, contracting.  El dolor [place].  Tiene una erupcin.  Se siente mareada o sufre un desmayo.  Tiene algn problema o sufre alguna reaccin a los medicamentos.  Necesita un medicamento ms fuerte o un cambio de Microbiologist.  Advierte un olor ftido que proviene de la herida o del vendaje.  La herida se abre luego de que le han extrado las suturas.  Est constipado. SOLICITE ATENCIN MDICA DE INMEDIATO SI:   Se desmaya.  Tiene fiebre.  Siente cada vez ms dolor abdominal.  Siente dolor intenso en los hombros.  Tiene un sangrado o drenaje en la sutura, o que proviene de la vagina (canal del parto) luego de la [place].  Le falta el aire o tiene dificultad para respirar.  Siente dolor en el pecho o en las piernas.  Tiene nuseas o vmitos persistentes. ASEGRESE DE QUE:   Comprende estas instrucciones.  Controlar su enfermedad.  Solicitar ayuda de inmediato si no mejora o si empeora. Document Released: 06/05/2012 [facility] Patient Information 2015 [place], [place]. This information is not intended to replace advice given to you by your health care provider. Make sure you discuss any questions you have with your health care provider.  Call  your doctor for increased pain or vaginal bleeding, temperature above 100.4, depression, or concerns.  No strenuous activity or heavy lifting for 6 weeks.  No intercourse, tampons, douching, or enemas for 6 weeks.  No tub baths-showers only.  No driving for 2 weeks or while taking pain medications.  Continue prenatal vitamin and iron.  Increase calories and fluids while breastfeeding.

## 2015-08-26 ENCOUNTER — Encounter: Payer: Self-pay | Admitting: Obstetrics and Gynecology

## 2015-08-26 LAB — SURGICAL PATHOLOGY

## 2015-11-02 ENCOUNTER — Inpatient Hospital Stay
Admission: EM | Admit: 2015-11-02 | Discharge: 2015-11-07 | DRG: 415 | Disposition: A | Discharge status: Home / Self Care | Payer: Medicaid Other | Attending: Surgery | Admitting: Surgery

## 2015-11-02 ENCOUNTER — Emergency Department: Payer: Medicaid Other

## 2015-11-02 ENCOUNTER — Encounter: Payer: Self-pay | Admitting: Emergency Medicine

## 2015-11-02 DIAGNOSIS — K8000 Calculus of gallbladder with acute cholecystitis without obstruction: Secondary | ICD-10-CM | POA: Diagnosis not present

## 2015-11-02 DIAGNOSIS — K660 Peritoneal adhesions (postprocedural) (postinfection): Secondary | ICD-10-CM | POA: Diagnosis present

## 2015-11-02 DIAGNOSIS — K810 Acute cholecystitis: Secondary | ICD-10-CM | POA: Diagnosis present

## 2015-11-02 DIAGNOSIS — Z8379 Family history of other diseases of the digestive system: Secondary | ICD-10-CM | POA: Diagnosis not present

## 2015-11-02 DIAGNOSIS — R109 Unspecified abdominal pain: Secondary | ICD-10-CM

## 2015-11-02 DIAGNOSIS — Z5331 Laparoscopic surgical procedure converted to open procedure: Secondary | ICD-10-CM | POA: Diagnosis not present

## 2015-11-02 DIAGNOSIS — I96 Gangrene, not elsewhere classified: Secondary | ICD-10-CM | POA: Diagnosis present

## 2015-11-02 DIAGNOSIS — Z419 Encounter for procedure for purposes other than remedying health state, unspecified: Secondary | ICD-10-CM

## 2015-11-02 DIAGNOSIS — Z880 Allergy status to penicillin: Secondary | ICD-10-CM | POA: Diagnosis not present

## 2015-11-02 LAB — CBC WITH DIFFERENTIAL/PLATELET
BASOS ABS: 0.1 10*3/uL (ref 0–0.1)
BASOS PCT: 1 %
EOS PCT: 0 %
Eosinophils Absolute: 0 10*3/uL (ref 0–0.7)
HCT: 38.7 % (ref 35.0–47.0)
Hemoglobin: 13.1 g/dL (ref 12.0–16.0)
LYMPHS PCT: 13 %
Lymphs Abs: 1.9 10*3/uL (ref 1.0–3.6)
MCH: 29.4 pg (ref 26.0–34.0)
MCHC: 33.8 g/dL (ref 32.0–36.0)
MCV: 87.1 fL (ref 80.0–100.0)
Monocytes Absolute: 0.8 10*3/uL (ref 0.2–0.9)
Monocytes Relative: 5 %
NEUTROS ABS: 11.6 10*3/uL — AB (ref 1.4–6.5)
Neutrophils Relative %: 81 %
PLATELETS: 227 10*3/uL (ref 150–440)
RBC: 4.45 MIL/uL (ref 3.80–5.20)
RDW: 13.9 % (ref 11.5–14.5)
WBC: 14.4 10*3/uL — AB (ref 3.6–11.0)

## 2015-11-02 LAB — COMPREHENSIVE METABOLIC PANEL
ALT: 20 U/L (ref 14–54)
AST: 16 U/L (ref 15–41)
Albumin: 4 g/dL (ref 3.5–5.0)
Alkaline Phosphatase: 49 U/L (ref 38–126)
Anion gap: 6 (ref 5–15)
BUN: 9 mg/dL (ref 6–20)
CO2: 28 mmol/L (ref 22–32)
Calcium: 9.3 mg/dL (ref 8.9–10.3)
Chloride: 102 mmol/L (ref 101–111)
Creatinine, Ser: 0.55 mg/dL (ref 0.44–1.00)
GFR calc Af Amer: >60 mL/min (ref >60)
GFR calc non Af Amer: >60 mL/min (ref >60)
Glucose, Bld: 128 mg/dL — ABNORMAL HIGH (ref 65–99)
Potassium: 3.7 mmol/L (ref 3.5–5.1)
Sodium: 136 mmol/L (ref 135–145)
Total Bilirubin: 0.6 mg/dL (ref 0.3–1.2)
Total Protein: 7.9 g/dL (ref 6.5–8.1)

## 2015-11-02 LAB — LIPASE, BLOOD: Lipase: 24 U/L (ref 11–51)

## 2015-11-02 LAB — POCT PREGNANCY, URINE: Preg Test, Ur: NEGATIVE

## 2015-11-02 MED ORDER — HYDROMORPHONE HCL 1 MG/ML IJ SOLN
0.5000 mg | Freq: Once | INTRAMUSCULAR | Status: AC
  Administered 2015-11-02: 0.5 mg via INTRAVENOUS

## 2015-11-02 MED ORDER — HYDROMORPHONE HCL 1 MG/ML IJ SOLN
0.5000 mg | INTRAMUSCULAR | Status: DC | PRN
  Administered 2015-11-02 – 2015-11-03 (×6): 0.5 mg via INTRAVENOUS
  Filled 2015-11-02 (×7): qty 1

## 2015-11-02 MED ORDER — ACETAMINOPHEN 650 MG RE SUPP: 650.0000 mg | Freq: Four times a day (QID) | RECTAL | Status: DC | PRN

## 2015-11-02 MED ORDER — ACETAMINOPHEN 325 MG PO TABS
650.0000 mg | ORAL_TABLET | Freq: Four times a day (QID) | ORAL | Status: DC | PRN
  Administered 2015-11-04 (×2): 650 mg via ORAL
  Filled 2015-11-02: qty 2

## 2015-11-02 MED ORDER — MORPHINE SULFATE (PF) 4 MG/ML IV SOLN
4.0000 mg | Freq: Once | INTRAVENOUS | Status: AC
  Administered 2015-11-02: 4 mg via INTRAVENOUS
  Filled 2015-11-02: qty 1

## 2015-11-02 MED ORDER — ONDANSETRON HCL 4 MG PO TABS: 4.0000 mg | ORAL_TABLET | Freq: Four times a day (QID) | ORAL | Status: DC | PRN

## 2015-11-02 MED ORDER — ONDANSETRON HCL 4 MG/2ML IJ SOLN
4.0000 mg | Freq: Once | INTRAMUSCULAR | Status: AC
  Administered 2015-11-02: 4 mg via INTRAVENOUS
  Filled 2015-11-02: qty 2

## 2015-11-02 MED ORDER — HYDROMORPHONE HCL 1 MG/ML IJ SOLN
0.5000 mg | Freq: Once | INTRAMUSCULAR | Status: AC
  Administered 2015-11-02: 0.5 mg via INTRAVENOUS
  Filled 2015-11-02: qty 1

## 2015-11-02 MED ORDER — SODIUM CHLORIDE 0.9 % IV SOLN
1.0000 g | INTRAVENOUS | Status: DC
  Administered 2015-11-02 – 2015-11-06 (×5): 1 g via INTRAVENOUS
  Filled 2015-11-02 (×6): qty 1

## 2015-11-02 MED ORDER — DEXTROSE IN LACTATED RINGERS 5 % IV SOLN
INTRAVENOUS | Status: DC
  Administered 2015-11-02 – 2015-11-07 (×13): via INTRAVENOUS

## 2015-11-02 MED ORDER — HYDROMORPHONE HCL 1 MG/ML IJ SOLN
INTRAMUSCULAR | Status: AC
  Administered 2015-11-02: 0.5 mg via INTRAVENOUS
  Filled 2015-11-02: qty 1

## 2015-11-02 MED ORDER — ONDANSETRON HCL 4 MG/2ML IJ SOLN
4.0000 mg | Freq: Four times a day (QID) | INTRAMUSCULAR | Status: DC | PRN
  Administered 2015-11-02: 4 mg via INTRAVENOUS

## 2015-11-02 NOTE — H&P (Signed)
Patient ID: Tammy Herrera, female   DOB: 17-Nov-1981, 34 y.o.   MRN: [id number]  Chief Complaint  Patient presents with  . Abdominal Pain    HPI   Tammy Herrera is a 34 y.o. female.  who presents to the emergency room this morning with the sudden onset of epigastric and right upper quadrant abdominal pain associated with nausea and vomiting on Friday evening. Over the course of the weekend she's continued to have pain along with anorexia and nausea. There is been no fevers. No jaundice. In speaking to her she is recently postpartum in September of this year after which she had a uncomplicated laparoscopic bilateral tubal ligation. She has 4 children. There is a strong family history of cholelithiasis requiring cholecystectomy. In reviewing her history also she describes a history of postprandial fatty food intolerance with abdominal pain in the right upper quadrant radiating to her right back for months now.  Workup in the emergency room is consistent with acute cholecystitis namely an ultrasound demonstrating gallbladder wall thickening and possibly a small amount of pericholecystic fluid on my review.  There is multiple gallstones and a 2.9 cm stone. Common bile duct was normal in size. The patient does not smoke does not drink. History is obtained from both her husband as well as with the assistance of a certified [place] interpreter.   Family history significant for cholelithiasis in her sister.  Past medical history is negative.   Past Surgical History  Procedure Laterality Date  . Tubal ligation Bilateral 08/22/2015    Procedure: POST PARTUM TUBAL LIGATION;  Surgeon: Boykin Nearing, MD;  Location: [facility] ORS;  Service: Gynecology;  Laterality: Bilateral;    History reviewed. No pertinent family history.  Social History Social History  Substance Use Topics  . Smoking status: Never Smoker   . Smokeless tobacco: None  . Alcohol Use: No    Allergies  Allergen  Reactions  . Penicillins Hives    Current Facility-Administered Medications  Medication Dose Route Frequency Provider Last Rate Last Dose  . acetaminophen (TYLENOL) tablet 650 mg  650 mg Oral Q6H PRN Sherri Rad, MD       Or  . acetaminophen (TYLENOL) suppository 650 mg  650 mg Rectal Q6H PRN Sherri Rad, MD      . dextrose 5 % in lactated ringers infusion   Intravenous Continuous Sherri Rad, MD 125 mL/hr at 11/02/15 1522    . ertapenem (INVANZ) 1 g in sodium chloride 0.9 % 50 mL IVPB  1 g Intravenous Q24H Sherri Rad, MD      . HYDROmorphone (DILAUDID) injection 0.5 mg  0.5 mg Intravenous Q2H PRN Sherri Rad, MD   0.5 mg at 11/02/15 1518  . ondansetron (ZOFRAN) tablet 4 mg  4 mg Oral Q6H PRN Sherri Rad, MD       Or  . ondansetron [facility]) injection 4 mg  4 mg Intravenous Q6H PRN Sherri Rad, MD   4 mg at 11/02/15 1321        Blood pressure 125/55, pulse 97, temperature 100.2 F (37.9 C), temperature source Oral, resp. rate 18, height _0  (1.6 m), weight 210 lb (95.255 kg), SpO2 99 %, unknown if currently breastfeeding.  Results for orders placed or performed during the hospital encounter of 11/02/15 (from the past 48 hour(s))  CBC WITH DIFFERENTIAL     Status: Abnormal   Collection Time: 11/02/15  7:02 AM  Result Value Ref Range   WBC 14.4 (H) 3.6 - 11.0  K/uL   RBC 4.45 3.80 - 5.20 MIL/uL   Hemoglobin 13.1 12.0 - 16.0 g/dL   HCT 38.7 35.0 - 47.0 %   MCV 87.1 80.0 - 100.0 fL   MCH 29.4 26.0 - 34.0 pg   MCHC 33.8 32.0 - 36.0 g/dL   RDW 13.9 11.5 - 14.5 %   Platelets 227 150 - 440 K/uL   Neutrophils Relative % 81 %   Neutro Abs 11.6 (H) 1.4 - 6.5 K/uL   Lymphocytes Relative 13 %   Lymphs Abs 1.9 1.0 - 3.6 K/uL   Monocytes Relative 5 %   Monocytes Absolute 0.8 0.2 - 0.9 K/uL   Eosinophils Relative 0 %   Eosinophils Absolute 0.0 0 - 0.7 K/uL   Basophils Relative 1 %   Basophils Absolute 0.1 0 - 0.1 K/uL  Comprehensive metabolic panel     Status: Abnormal   Collection Time: 11/02/15   7:02 AM  Result Value Ref Range   Sodium 136 135 - 145 mmol/L   Potassium 3.7 3.5 - 5.1 mmol/L   Chloride 102 101 - 111 mmol/L   CO2 28 22 - 32 mmol/L   Glucose, Bld 128 (H) 65 - 99 mg/dL   BUN 9 6 - 20 mg/dL   Creatinine, Ser 0.55 0.44 - 1.00 mg/dL   Calcium 9.3 8.9 - 10.3 mg/dL   Total Protein 7.9 6.5 - 8.1 g/dL   Albumin 4.0 3.5 - 5.0 g/dL   AST 16 15 - 41 U/L   ALT 20 14 - 54 U/L   Alkaline Phosphatase 49 38 - 126 U/L   Total Bilirubin 0.6 0.3 - 1.2 mg/dL   GFR calc non Af Amer >60 >60 mL/min   GFR calc Af Amer >60 >60 mL/min    Comment: (NOTE) The eGFR has been calculated using the CKD EPI equation. This calculation has not been validated in all clinical situations. eGFR's persistently <60 mL/min signify possible Chronic Kidney Disease.    Anion gap 6 5 - 15  Lipase, blood     Status: None   Collection Time: 11/02/15  7:02 AM  Result Value Ref Range   Lipase 24 11 - 51 U/L  Pregnancy, urine POC     Status: None   Collection Time: 11/02/15  8:03 AM  Result Value Ref Range   Preg Test, Ur NEGATIVE NEGATIVE    Comment:        THE SENSITIVITY OF THIS METHODOLOGY IS >24 mIU/mL    US Abdomen Limited Ruq  11/02/2015  CLINICAL DATA:  Three days of right upper quadrant pain EXAM: US ABDOMEN LIMITED - RIGHT UPPER QUADRANT COMPARISON:  None in PACs FINDINGS: Gallbladder: The gallbladder is contracted and contains multiple echogenic shadowing stones. The largest stone is measured at 2.9 cm. The gallbladder wall is quite thickened up to 14 mm. There is a positive sonographic Murphy's sign. No pericholecystic fluid is observed. Common bile duct: Diameter: 3.8 mm Liver: The hepatic echotexture is mildly increased. There is no focal mass or ductal dilation. IMPRESSION: 1. Contracted gallbladder containing multiple stones and exhibiting wall thickening and positive sonographic Murphy's sign. The findings are consistent with acute cholecystitis. 2. Fatty infiltrative change of the liver.  Electronically Signed   By: David  Martinique M.D.   On: 11/02/2015 08:27    Review of Systems  Constitutional: Positive for diaphoresis. Negative for fever, chills, weight loss and malaise/fatigue.  HENT: Negative for hearing loss.   Eyes: Negative for blurred vision.  Respiratory:  Negative for cough and hemoptysis.   Cardiovascular: Negative for chest pain.  Gastrointestinal: Positive for heartburn, nausea, vomiting and abdominal pain. Negative for diarrhea, constipation, blood in stool and melena.  Genitourinary: Negative for dysuria.  Musculoskeletal: Negative.   Skin: Negative.   Neurological: Positive for weakness. Negative for headaches.  Endo/Heme/Allergies: Negative.   Psychiatric/Behavioral: Negative.     Physical Exam  Constitutional: She is oriented to person, place, and time. She appears distressed.  HENT:  Head: Normocephalic and atraumatic.  Eyes: Conjunctivae are normal. No scleral icterus.  Neck: Normal range of motion. Neck supple. No thyromegaly present.  Cardiovascular: Normal rate and regular rhythm.   No murmur heard. Pulmonary/Chest: Effort normal and breath sounds normal. No respiratory distress. She has no wheezes.  Abdominal: Soft. Normal appearance and bowel sounds are normal. There is tenderness in the right upper quadrant and epigastric area. There is positive Murphy's sign. No hernia.    Neurological: She is oriented to person, place, and time.  Skin: Skin is warm and dry. She is not diaphoretic.  Psychiatric: Mood, memory, affect and judgment normal.    Data Reviewed I personally reviewed the ultrasound images along with the report and agree with their interpretation.  I have personally reviewed the patient's imaging, laboratory findings and medical records.    Assessment    This is a young woman with acute calculus cholecystitis and likely evolving hydrops.    Plan    The patient will be admitted hydrated intravenous antibiotics will be  administered. I discussed with her and her husband with the assistance of a certified Spanish interpreter supplied by the Hospital laparoscopic cholecystectomy the intended risks of bile duct injury being 1 in 200 as well as the possibility of an open procedure.  All of her questions were answered.      Sherri Rad MD, FACS 11/02/2015, 4:58 PM

## 2015-11-02 NOTE — ED Notes (Addendum)
Report attempted 

## 2015-11-02 NOTE — ED Provider Notes (Signed)
[facility] Emergency Department Provider Note  ____________________________________________  Time seen: 7 AM  I have reviewed the triage vital signs and the nursing notes.   HISTORY  Chief Complaint Abdominal Pain    HPI Tammy Herrera is a 34 y.o. female who presents with complaints of moderate to severe right upper quadrant pain which she describes as stabbing that started on Friday. She reports the pains come and gone but significant worsened by eating. She is having nausea. She denies fevers or chills. No sick contacts. She notes sometimes the pain radiates down her right abdomen but it is primarily located in the right upper quadrant. Occasionally she will feel the pain traveled to her shoulder blade on the right. She denies chest pain. No history of similar pain. She is not taking anything for the pain.     History reviewed. No pertinent past medical history.  Patient Active Problem List   Diagnosis Date Noted  . Acute cholecystitis 11/02/2015  . Postpartum state 08/22/2015  . Normal labor and delivery 08/21/2015  . Blood in urine 07/08/2015    Past Surgical History  Procedure Laterality Date  . Tubal ligation Bilateral 08/22/2015    Procedure: POST PARTUM TUBAL LIGATION;  Surgeon: Suzy Bouchard, MD;  Location: [facility] ORS;  Service: Gynecology;  Laterality: Bilateral;    Current Outpatient Rx  Name  Route  Sig  Dispense  Refill  . Prenatal Vit-Fe Fumarate-FA (MULTIVITAMIN-PRENATAL) 27-0.8 MG TABS tablet   Oral   Take 1 tablet by mouth daily.          Marland Kitchen ibuprofen (ADVIL,MOTRIN) 600 MG tablet   Oral   Take 1 tablet (600 mg total) by mouth every 6 (six) hours.   30 tablet   0   . oxyCODONE-acetaminophen (PERCOCET/ROXICET) 5-325 MG per tablet   Oral   Take 1 tablet by mouth every 4 (four) hours as needed (for pain scale greater than or equal to 4 and less than 7).   20 tablet   0     Allergies Penicillins  History  reviewed. No pertinent family history.  Social History Social History  Substance Use Topics  . Smoking status: Never Smoker   . Smokeless tobacco: None  . Alcohol Use: No    Review of Systems  Constitutional: Negative for fever. Eyes: Negative for visual changes. ENT: Negative for sore throat Cardiovascular: Negative for chest pain. Respiratory: Negative for shortness of breath. Gastrointestinal: Negative for diarrhea Genitourinary: Negative for dysuria. Musculoskeletal: Negative for back pain. Skin: Negative for rash. Neurological: Negative for headaches or focal weakness Psychiatric: No anxiety    ____________________________________________   PHYSICAL EXAM:  VITAL SIGNS: ED Triage Vitals  Enc Vitals Group     BP 11/02/15 0611 128/66 mmHg     Pulse Rate 11/02/15 0611 72     Resp 11/02/15 0611 18     Temp 11/02/15 0611 98.4 F (36.9 C)     Temp Source 11/02/15 0611 Oral     SpO2 11/02/15 0611 98 %     Weight 11/02/15 0611 210 lb (95.255 kg)     Height 11/02/15 0611  (1.6 m)     Head Cir --      Peak Flow --      Pain Score 11/02/15 0611 9     Pain Loc --      Pain Edu? --      Excl. in GC? --      Constitutional: Alert and oriented.  Well appearing but uncomfortable Eyes: Conjunctivae are normal.  ENT   Head: Normocephalic and atraumatic.   Mouth/Throat: Mucous membranes are moist. Cardiovascular: Normal rate, regular rhythm. Normal and symmetric distal pulses are present in all extremities. No murmurs, rubs, or gallops. Respiratory: Normal respiratory effort without tachypnea nor retractions. Breath sounds are clear and equal bilaterally.  Gastrointestinal: Patient with significant tenderness to palpation in the right upper quadrant, positive Murphy's sign. No distention. There is no CVA tenderness. Genitourinary: deferred Musculoskeletal: Nontender with normal range of motion in all extremities. No lower extremity tenderness nor  edema. Neurologic:  Normal speech and language. No gross focal neurologic deficits are appreciated. Skin:  Skin is warm, dry and intact. No rash noted. Psychiatric: Mood and affect are normal. Patient exhibits appropriate insight and judgment.  ____________________________________________    LABS (pertinent positives/negatives)  Labs Reviewed  CBC WITH DIFFERENTIAL/PLATELET - Abnormal; Notable for the following:    WBC 14.4 (*)    Neutro Abs 11.6 (*)    All other components within normal limits  COMPREHENSIVE METABOLIC PANEL - Abnormal; Notable for the following:    Glucose, Bld 128 (*)    All other components within normal limits  LIPASE, BLOOD  URINALYSIS COMPLETEWITH MICROSCOPIC ([facility] ONLY)  POCT PREGNANCY, URINE    ____________________________________________   EKG  None  ____________________________________________    RADIOLOGY I have personally reviewed any xrays that were ordered on this patient: Ultrasound consistent with cholecystitis  ____________________________________________   PROCEDURES  Procedure(s) performed: none  Critical Care performed: none  ____________________________________________   INITIAL IMPRESSION / ASSESSMENT AND PLAN / ED COURSE  Pertinent labs & imaging results that were available during my care of the patient were reviewed by me and considered in my medical decision making (see chart for details).  Patient with significant right upper quadrant pain with worsening with by mouth intake most concerning for cholecystitis versus pancreatitis versus PUD. We will give morphine and Zofran IV obtain labs, right upper quadrant ultrasound and reevaluate  ----------------------------------------- 9:05 AM on 11/02/2015 -----------------------------------------  Surgery consulted given exam and ultrasound consistent with cholecystitis  ----------------------------------------- 9:25 AM on  11/02/2015 -----------------------------------------  Discussed ultrasound findings with patient via interpreter, she claims to have pain we will give more morphine  ____________________________________________   FINAL CLINICAL IMPRESSION(S) / ED DIAGNOSES  Final diagnoses:  Abdominal pain  Acute cholecystitis     Jene Everyobert Seung Nidiffer, MD 11/02/15 1354

## 2015-11-02 NOTE — ED Notes (Signed)
Pt requiring spanish interpreter; request sent

## 2015-11-02 NOTE — ED Notes (Addendum)
Pt c/o epigastric pain and right upper quadrant pain intermittently since Friday; N/V; denies diarrhea;pt says pain eased off but then returned and is radiating down the right side of her abd;  pt restless in triage

## 2015-11-03 ENCOUNTER — Encounter: Payer: Self-pay | Admitting: Anesthesiology

## 2015-11-03 ENCOUNTER — Observation Stay: Payer: Medicaid Other | Admitting: Anesthesiology

## 2015-11-03 ENCOUNTER — Observation Stay: Payer: Medicaid Other

## 2015-11-03 ENCOUNTER — Encounter
Admission: EM | Disposition: A | Discharge status: Home / Self Care | Payer: Self-pay | Source: Home / Self Care | Attending: Surgery

## 2015-11-03 HISTORY — PX: CHOLECYSTECTOMY: SHX55

## 2015-11-03 LAB — MRSA PCR SCREENING: MRSA by PCR: NEGATIVE

## 2015-11-03 SURGERY — LAPAROSCOPIC CHOLECYSTECTOMY
Anesthesia: General | Site: Abdomen | Wound class: Clean Contaminated

## 2015-11-03 MED ORDER — MORPHINE SULFATE 2 MG/ML IV SOLN
INTRAVENOUS | Status: DC
  Filled 2015-11-03: qty 25

## 2015-11-03 MED ORDER — NALOXONE HCL 0.4 MG/ML IJ SOLN: 0.4000 mg | INTRAMUSCULAR | Status: DC | PRN

## 2015-11-03 MED ORDER — GLYCOPYRROLATE 0.2 MG/ML IJ SOLN
INTRAMUSCULAR | Status: DC | PRN
  Administered 2015-11-03: 0.6 mg via INTRAVENOUS

## 2015-11-03 MED ORDER — FENTANYL CITRATE (PF) 100 MCG/2ML IJ SOLN
25.0000 ug | INTRAMUSCULAR | Status: DC | PRN
  Administered 2015-11-03 (×4): 25 ug via INTRAVENOUS
  Filled 2015-11-03 (×4): qty 0.5

## 2015-11-03 MED ORDER — NEOSTIGMINE METHYLSULFATE 10 MG/10ML IV SOLN
INTRAVENOUS | Status: DC | PRN
  Administered 2015-11-03: 5 mg via INTRAVENOUS

## 2015-11-03 MED ORDER — DIPHENHYDRAMINE HCL 50 MG/ML IJ SOLN: 12.5000 mg | Freq: Four times a day (QID) | INTRAMUSCULAR | Status: DC | PRN

## 2015-11-03 MED ORDER — ONDANSETRON HCL 4 MG/2ML IJ SOLN: 4.0000 mg | Freq: Four times a day (QID) | INTRAMUSCULAR | Status: DC | PRN

## 2015-11-03 MED ORDER — PROPOFOL 10 MG/ML IV BOLUS
INTRAVENOUS | Status: DC | PRN
  Administered 2015-11-03: 50 mg via INTRAVENOUS
  Administered 2015-11-03: 150 mg via INTRAVENOUS

## 2015-11-03 MED ORDER — ACETAMINOPHEN 10 MG/ML IV SOLN
INTRAVENOUS | Status: DC | PRN
  Administered 2015-11-03: 1000 mg via INTRAVENOUS

## 2015-11-03 MED ORDER — ONDANSETRON HCL 4 MG/2ML IJ SOLN: 4.0000 mg | Freq: Once | INTRAMUSCULAR | Status: DC | PRN

## 2015-11-03 MED ORDER — MIDAZOLAM HCL 2 MG/2ML IJ SOLN
INTRAMUSCULAR | Status: DC | PRN
  Administered 2015-11-03: 2 mg via INTRAVENOUS

## 2015-11-03 MED ORDER — LACTATED RINGERS IV SOLN
INTRAVENOUS | Status: DC | PRN
  Administered 2015-11-03: 14:00:00 via INTRAVENOUS

## 2015-11-03 MED ORDER — ROCURONIUM BROMIDE 100 MG/10ML IV SOLN
INTRAVENOUS | Status: DC | PRN
  Administered 2015-11-03 (×2): 15 mg via INTRAVENOUS
  Administered 2015-11-03: 10 mg via INTRAVENOUS
  Administered 2015-11-03: 30 mg via INTRAVENOUS
  Administered 2015-11-03: 20 mg via INTRAVENOUS
  Administered 2015-11-03: 10 mg via INTRAVENOUS
  Administered 2015-11-03: 20 mg via INTRAVENOUS

## 2015-11-03 MED ORDER — SUCCINYLCHOLINE CHLORIDE 20 MG/ML IJ SOLN
INTRAMUSCULAR | Status: DC | PRN
  Administered 2015-11-03: 100 mg via INTRAVENOUS

## 2015-11-03 MED ORDER — DIPHENHYDRAMINE HCL 12.5 MG/5ML PO ELIX: 12.5000 mg | ORAL_SOLUTION | Freq: Four times a day (QID) | ORAL | Status: DC | PRN

## 2015-11-03 MED ORDER — FENTANYL CITRATE (PF) 100 MCG/2ML IJ SOLN
INTRAMUSCULAR | Status: DC | PRN
  Administered 2015-11-03: 150 ug via INTRAVENOUS
  Administered 2015-11-03 (×2): 100 ug via INTRAVENOUS

## 2015-11-03 MED ORDER — SODIUM CHLORIDE 0.9 % IJ SOLN: 9.0000 mL | INTRAMUSCULAR | Status: DC | PRN

## 2015-11-03 MED ORDER — MORPHINE SULFATE 2 MG/ML IV SOLN
INTRAVENOUS | Status: DC
  Administered 2015-11-03: 19:00:00 via INTRAVENOUS
  Administered 2015-11-03: 12 mg via INTRAVENOUS
  Administered 2015-11-04: 2 mg via INTRAVENOUS
  Administered 2015-11-04: 15:00:00 via INTRAVENOUS
  Administered 2015-11-04: 13.5 mg via INTRAVENOUS
  Administered 2015-11-04: via INTRAVENOUS
  Administered 2015-11-04: 16.5 mg via INTRAVENOUS
  Administered 2015-11-04: 10.5 mg via INTRAVENOUS
  Administered 2015-11-05 (×2): via INTRAVENOUS
  Administered 2015-11-05: 13.5 mg via INTRAVENOUS
  Administered 2015-11-05 (×2): via INTRAVENOUS
  Administered 2015-11-05: 7.5 mg via INTRAVENOUS
  Administered 2015-11-05: 42 mg via INTRAVENOUS
  Administered 2015-11-06: 7.5 mg via INTRAVENOUS
  Filled 2015-11-03 (×5): qty 25

## 2015-11-03 SURGICAL SUPPLY — 60 items
APPLICATOR SURGIFLO ENDO (HEMOSTASIS) ×3 IMPLANT
APPLIER CLIP 5 13 M/L LIGAMAX5 (MISCELLANEOUS) ×3
BAG COUNTER SPONGE EZ (MISCELLANEOUS) IMPLANT
BULB RESERV EVAC DRAIN JP 100C (MISCELLANEOUS) ×3 IMPLANT
CANISTER SUCT 1200ML W/VALVE (MISCELLANEOUS) ×3 IMPLANT
CATH CHOLANG 76X19 KUMAR (CATHETERS) IMPLANT
CHLORAPREP W/TINT 26ML (MISCELLANEOUS) ×3 IMPLANT
CLEANER CAUTERY TIP 5X5 PAD (MISCELLANEOUS) ×1 IMPLANT
CLIP APPLIE 5 13 M/L LIGAMAX5 (MISCELLANEOUS) ×1 IMPLANT
CLOSURE WOUND 1/2 X4 (GAUZE/BANDAGES/DRESSINGS)
COUNTER SPONGE BAG EZ (MISCELLANEOUS)
DEFOGGER SCOPE WARMER CLEARIFY (MISCELLANEOUS) ×3 IMPLANT
DISSECTOR KITTNER STICK (MISCELLANEOUS) IMPLANT
DISSECTORS/KITTNER STICK (MISCELLANEOUS)
DRAIN CHANNEL JP 19F (MISCELLANEOUS) ×3 IMPLANT
DRAPE C-ARM XRAY 36X54 (DRAPES) IMPLANT
DRAPE SHEET LG 3/4 BI-LAMINATE (DRAPES) ×3 IMPLANT
DRAPE UTILITY 15X26 TOWEL STRL (DRAPES) ×6 IMPLANT
DRSG TEGADERM 2-3/8X2-3/4 SM (GAUZE/BANDAGES/DRESSINGS) ×12 IMPLANT
DRSG TELFA 3X8 NADH (GAUZE/BANDAGES/DRESSINGS) ×3 IMPLANT
ELECT BLADE 6.5 EXT (BLADE) ×3 IMPLANT
ELECT CAUTERY BLADE 6.4 (BLADE) ×3 IMPLANT
ENDOPOUCH RETRIEVER 10 (MISCELLANEOUS) IMPLANT
GLOVE BIO SURGEON STRL SZ7.5 (GLOVE) ×3 IMPLANT
GOWN STRL REUS W/ TWL LRG LVL3 (GOWN DISPOSABLE) ×2 IMPLANT
GOWN STRL REUS W/ TWL XL LVL3 (GOWN DISPOSABLE) ×1 IMPLANT
GOWN STRL REUS W/TWL LRG LVL3 (GOWN DISPOSABLE) ×4
GOWN STRL REUS W/TWL XL LVL3 (GOWN DISPOSABLE) ×2
HANDLE YANKAUER SUCT BULB TIP (MISCELLANEOUS) ×6 IMPLANT
IRRIGATION STRYKERFLOW (MISCELLANEOUS) ×1 IMPLANT
IRRIGATOR STRYKERFLOW (MISCELLANEOUS) ×3
IV NS 1000ML (IV SOLUTION) ×2
IV NS 1000ML BAXH (IV SOLUTION) ×1 IMPLANT
LABEL OR SOLS (LABEL) ×3 IMPLANT
NEEDLE HYPO 25X1 1.5 SAFETY (NEEDLE) ×3 IMPLANT
NS IRRIG 500ML POUR BTL (IV SOLUTION) ×3 IMPLANT
PACK LAP CHOLECYSTECTOMY (MISCELLANEOUS) ×3 IMPLANT
PAD CLEANER CAUTERY TIP 5X5 (MISCELLANEOUS) ×2
PAD GROUND ADULT SPLIT (MISCELLANEOUS) ×3 IMPLANT
PENCIL ELECTRO HAND CTR (MISCELLANEOUS) ×3 IMPLANT
SCISSORS METZENBAUM CVD 33 (INSTRUMENTS) ×3 IMPLANT
SLEEVE ADV FIXATION 5X100MM (TROCAR) ×6 IMPLANT
SPOGE SURGIFLO 8M (HEMOSTASIS) ×2
SPONGE KITTNER 5P (MISCELLANEOUS) ×6 IMPLANT
SPONGE LAP 18X18 5 PK (GAUZE/BANDAGES/DRESSINGS) ×6 IMPLANT
SPONGE SURGIFLO 8M (HEMOSTASIS) ×1 IMPLANT
STAPLER SKIN PROX 35W (STAPLE) ×3 IMPLANT
STRAP SAFETY BODY (MISCELLANEOUS) ×3 IMPLANT
STRIP CLOSURE SKIN 1/2X4 (GAUZE/BANDAGES/DRESSINGS) IMPLANT
SUT ETHILON 3-0 FS-10 30 BLK (SUTURE)
SUT SILK 2 0 (SUTURE) ×2
SUT SILK 2-0 30XBRD TIE 12 (SUTURE) ×1 IMPLANT
SUT SILK 3 0 SH 30 (SUTURE) ×6 IMPLANT
SUT VIC AB 0 UR5 27 (SUTURE) ×9 IMPLANT
SUT VIC AB 4-0 FS2 27 (SUTURE) ×3 IMPLANT
SUTURE EHLN 3-0 FS-10 30 BLK (SUTURE) IMPLANT
SWABSTK COMLB BENZOIN TINCTURE (MISCELLANEOUS) ×3 IMPLANT
TROCAR XCEL BLUNT TIP 100MML (ENDOMECHANICALS) ×3 IMPLANT
TROCAR Z-THREAD OPTICAL 5X100M (TROCAR) ×3 IMPLANT
TUBING INSUFFLATOR HI FLOW (MISCELLANEOUS) ×3 IMPLANT

## 2015-11-03 NOTE — Progress Notes (Signed)
Surgery  Patient continues to have right upper quadrant abdominal pain. Plan is for laparoscopic cholecystectomy today. I discussed this with her again all of her questions were answered. She still wishes to proceed.

## 2015-11-03 NOTE — Op Note (Signed)
11/02/2015 - 11/03/2015  4:41 PM  PATIENT:  Tammy Herrera  34 y.o. female  PRE-OPERATIVE DIAGNOSIS:  Acute calculus cholecystitis  POST-OPERATIVE DIAGNOSIS:  Acute gangrenous calculus cholecystitis  PROCEDURE:  Attempted laparoscopic cholecystectomy with conversion to open cholecystectomy  SURGEON:  Surgeon(s) and Role:    * Natale Lay, MD - Primary  ASSISTANTS: Dr. Thelma Barge  ANESTHESIA: Oral endotracheal     SPECIMEN:  Gangrenous gallbladder and contents including one 3 cm stone    EBL: 250 cc  Description of procedure: Dragon dictation.  With informed consent obtained from the patient and help with the certified medical translator the patient was brought to the operating room and positioned supine general oral endotracheal anesthesia was induced. Left arm was padded and tucked at her side. The patient's abdomen was widely prepped and draped core prep solution. A timeout was observed.  The previous infraumbilical transversely oriented skin incision was opened sharply with scalpel and carried down with sharp dissection to the midline fascia. Midline fascia was incised in a vertical orientation with scalpel and stay sutures passed to the fascia. The peritoneum was entered sharply.  A 12 mm blunt Hassan trocar was placed under direct visualization.  The patient was in position in reverse Trendelenburg and airplaned right side up.  A 5 mm bladeless trocar was placed in the epigastric region. Two 5 mm trochars were placed in the right subcostal margin.  The gallbladder was nearly intrahepatic. The falciform ligament was to the left of the gallbladder. A very unusual orientation. The left lobe of the liver seemed to encompass the majority of the gallbladder.  The gallbladder was tensely distended, edematous with patchy areas of gangrene.  Omental adhesions were taken down from the gallbladder body with blunt technique. Gallbladder was aspirated of thick motor oil appearing bile. The  gallbladder fundus tore easily with grasping. Hartman's pouch could not easily be identified due to the amount of swelling and distortion. There was a large stone lodged in the neck of the gallbladder. A dome down attempt at cholecystectomy was performed but due to the left-sided nature of the gallbladder this was unsuccessful.  The decision to an performed an open cholecystectomy was made. A right upper quadrant incision was fashion transversely in a subcostal fashion with muscular fascial layers being divided with electrocautery. Self-retaining abdominal wall retractor was placed. The right lobe of the liver was elevated into the wound with the help of a laparotomy tape. The duodenal sweep and transverse colon was swept out of the way with the help of retractor laparotomy tapes.  The gallbladder was then removed from the gallbladder fossa utilizing electrocautery as well as finger fracture technique. The posterior wall was completely necrotic. Dissection of the hepatoduodenal ligament liberated a cystic artery with the small posterior branch. The posterior branch of the artery was encountered in the dome down dissection and was suture ligated on the gallbladder side as well as on the liver side. This is a accomplished with 3-0 silk suture.  A cystic duct was clearly identified. It did not appear to be necrotic. It was doubly clipped on the portal side with 10 mm clips clamped with a right angle and this doubly ligated with #3-0 silk suture.  The cystic artery was divided between 10 mm hemoclips. The gallbladder was handed off as specimen.  The right upper quadrant and gallbladder fossa was closely irrigated of all bile and blood. A 19 mm Blake drain was directed into Morrison's pouch.  Drain site was secured  with 3-0 nylon suture. Surgi-Flo with thrombin application was applied to the gallbladder fossa and hemostasis appeared be adequate on the operative field. Inspection of the transverse colon distal  stomach and duodenal sweep demonstrated no evidence of enteric injury. Lap and needle count was correct 2.   The abdominal fascia was then closed in 2 layers utilizing running #1 Vicryls from the extremes of the wound.  Skin staples used to reapproximate skin edges. The infra umbilical fascial defect was reapproximated with a figure-of-eight #0 Vicryl suture. Sterile dressings were applied. The completion of the operation abdominal x-ray was obtained due to inadequate instrument count and personally read by me found to be unremarkable of retained foreign object.  The patient was then subsequently extubated and taken to the recovery room in stable and satisfactory condition by anesthesia services.   Raynald KempMark A Caswell Alvillar, MD, FACS

## 2015-11-03 NOTE — Transfer of Care (Signed)
Immediate Anesthesia Transfer of Care Note  Patient: Tammy PleasureDora Jaime Herrera  Procedure(s) Performed: Procedure(s): LAPAROSCOPIC CHOLECYSTECTOMY (N/A)  Patient Location: PACU  Anesthesia Type:General  Level of Consciousness: awake  Airway & Oxygen Therapy: Patient Spontanous Breathing and Patient connected to nasal cannula oxygen  Post-op Assessment: Report given to RN and Post -op Vital signs reviewed and stable  Post vital signs: Reviewed and stable  Last Vitals:  Filed Vitals:   11/03/15 1614  BP: 128/72  Pulse: 99  Temp: 37.5 C  Resp: 24    Complications: No apparent anesthesia complications

## 2015-11-03 NOTE — Progress Notes (Signed)
Explained surgery handout, TEDs/SCDs, CHG bath and MRSA screening through language line; pt. Voices that she has no other questions. Windy Carinaurner,Rochel Privett K, RN; 7:55 AM; 11/03/2015

## 2015-11-03 NOTE — Anesthesia Preprocedure Evaluation (Signed)
Anesthesia Evaluation  Patient identified by MRN, date of birth, ID band Patient awake    Airway Mallampati: II  TM Distance: >3 FB Neck ROM: Full    Dental  (+) Chipped   Pulmonary neg pulmonary ROS,    Pulmonary exam normal breath sounds clear to auscultation       Cardiovascular negative cardio ROS Normal cardiovascular exam     Neuro/Psych negative neurological ROS  negative psych ROS   GI/Hepatic negative GI ROS, Neg liver ROS,   Endo/Other  negative endocrine ROS  Renal/GU negative Renal ROS  negative genitourinary   Musculoskeletal negative musculoskeletal ROS (+)   Abdominal Normal abdominal exam  (+)   Peds negative pediatric ROS (+)  Hematology negative hematology ROS (+)   Anesthesia Other Findings   Reproductive/Obstetrics negative OB ROS                             Anesthesia Physical Anesthesia Plan  ASA: II  Anesthesia Plan: General   Post-op Pain Management:    Induction: Intravenous  Airway Management Planned: Oral ETT  Additional Equipment:   Intra-op Plan:   Post-operative Plan: Extubation in OR  Informed Consent: I have reviewed the patients History and Physical, chart, labs and discussed the procedure including the risks, benefits and alternatives for the proposed anesthesia with the patient or authorized representative who has indicated his/her understanding and acceptance.   Dental advisory given  Plan Discussed with: CRNA and Surgeon  Anesthesia Plan Comments:         Anesthesia Quick Evaluation

## 2015-11-04 ENCOUNTER — Encounter: Payer: Self-pay | Admitting: Surgery

## 2015-11-04 DIAGNOSIS — K810 Acute cholecystitis: Secondary | ICD-10-CM

## 2015-11-04 LAB — CBC
HEMATOCRIT: 37.4 % (ref 35.0–47.0)
HEMOGLOBIN: 12.8 g/dL (ref 12.0–16.0)
MCH: 30.4 pg (ref 26.0–34.0)
MCHC: 34.3 g/dL (ref 32.0–36.0)
MCV: 88.6 fL (ref 80.0–100.0)
Platelets: 209 10*3/uL (ref 150–440)
RBC: 4.23 MIL/uL (ref 3.80–5.20)
RDW: 13.9 % (ref 11.5–14.5)
WBC: 15.4 10*3/uL — ABNORMAL HIGH (ref 3.6–11.0)

## 2015-11-04 LAB — BASIC METABOLIC PANEL
ANION GAP: 5 (ref 5–15)
BUN: 8 mg/dL (ref 6–20)
CHLORIDE: 105 mmol/L (ref 101–111)
CO2: 26 mmol/L (ref 22–32)
CREATININE: 0.46 mg/dL (ref 0.44–1.00)
Calcium: 8.1 mg/dL — ABNORMAL LOW (ref 8.9–10.3)
GFR calc non Af Amer: >60 mL/min (ref >60)
Glucose, Bld: 140 mg/dL — ABNORMAL HIGH (ref 65–99)
POTASSIUM: 3.8 mmol/L (ref 3.5–5.1)
SODIUM: 136 mmol/L (ref 135–145)

## 2015-11-04 MED ORDER — LACTATED RINGERS IV BOLUS (SEPSIS)
1000.0000 mL | Freq: Once | INTRAVENOUS | Status: AC
  Administered 2015-11-04: 1000 mL via INTRAVENOUS

## 2015-11-04 NOTE — Progress Notes (Signed)
Notified Dr Egbert GaribaldiBird that pt HR in 120's'130's; Dr acknowledged, ordered to place pt on tele, ordered incentive spirometry

## 2015-11-04 NOTE — Progress Notes (Signed)
Surgery  POD 1   S/P open chole for gangrenous cholecystitis  Main c/o is pain Febrile overnight to 102F No bile in JP.   Filed Vitals:   11/04/15 1006 11/04/15 1342 11/04/15 1525 11/04/15 1527  BP:  100/57    Pulse:  121    Temp:      TempSrc:      Resp: 25 17 27 24   Height:      Weight:      SpO2: 96% 99% 95% 95%    PE: dressing dry, JP no bile.   Labs  CBC Latest Ref Rng 11/04/2015 11/02/2015 08/22/2015  WBC 3.6 - 11.0 K/uL 15.4(H) 14.4(H) 10.6  Hemoglobin 12.0 - 16.0 g/dL 16.112.8 09.613.1 04.512.3  Hematocrit 35.0 - 47.0 % 37.4 38.7 34.2(L)  Platelets 150 - 440 K/uL 209 227 178   CMP Latest Ref Rng 11/04/2015 11/02/2015  Glucose 65 - 99 mg/dL 409(W140(H) 119(J128(H)  BUN 6 - 20 mg/dL 8 9  Creatinine 4.780.44 - 1.00 mg/dL 2.950.46 6.210.55  Sodium 308135 - 145 mmol/L 136 136  Potassium 3.5 - 5.1 mmol/L 3.8 3.7  Chloride 101 - 111 mmol/L 105 102  CO2 22 - 32 mmol/L 26 28  Calcium 8.9 - 10.3 mg/dL 8.1(L) 9.3  Total Protein 6.5 - 8.1 g/dL - 7.9  Total Bilirubin 0.3 - 1.2 mg/dL - 0.6  Alkaline Phos 38 - 126 U/L - 49  AST 15 - 41 U/L - 16  ALT 14 - 54 U/L - 20    I/O last 3 completed shifts: In: 5967.8 [I.V.:5474.8; Other:60; IV Piggyback:433] Out: 2650 [Urine:2400; Blood:250] Total I/O In: 1551 [P.O.:120; I.V.:1431] Out: 1560 [Urine:1550; Drains:10]    IMP Stable, febrile.   Plan  Telemetry, incentive spiro.  Mobilize. Cont invanz.

## 2015-11-05 DIAGNOSIS — K660 Peritoneal adhesions (postprocedural) (postinfection): Secondary | ICD-10-CM | POA: Diagnosis present

## 2015-11-05 DIAGNOSIS — Z8379 Family history of other diseases of the digestive system: Secondary | ICD-10-CM | POA: Diagnosis not present

## 2015-11-05 DIAGNOSIS — K810 Acute cholecystitis: Secondary | ICD-10-CM | POA: Diagnosis present

## 2015-11-05 DIAGNOSIS — Z5331 Laparoscopic surgical procedure converted to open procedure: Secondary | ICD-10-CM | POA: Diagnosis not present

## 2015-11-05 DIAGNOSIS — K8000 Calculus of gallbladder with acute cholecystitis without obstruction: Secondary | ICD-10-CM | POA: Diagnosis present

## 2015-11-05 DIAGNOSIS — Z880 Allergy status to penicillin: Secondary | ICD-10-CM | POA: Diagnosis not present

## 2015-11-05 DIAGNOSIS — I96 Gangrene, not elsewhere classified: Secondary | ICD-10-CM | POA: Diagnosis present

## 2015-11-05 LAB — COMPREHENSIVE METABOLIC PANEL
ALBUMIN: 2.5 g/dL — AB (ref 3.5–5.0)
ALK PHOS: 71 U/L (ref 38–126)
ALT: 69 U/L — ABNORMAL HIGH (ref 14–54)
ANION GAP: 5 (ref 5–15)
AST: 36 U/L (ref 15–41)
BILIRUBIN TOTAL: 0.9 mg/dL (ref 0.3–1.2)
BUN: 6 mg/dL (ref 6–20)
CALCIUM: 8.1 mg/dL — AB (ref 8.9–10.3)
CO2: 32 mmol/L (ref 22–32)
Chloride: 99 mmol/L — ABNORMAL LOW (ref 101–111)
Creatinine, Ser: 0.47 mg/dL (ref 0.44–1.00)
GFR calc non Af Amer: >60 mL/min (ref >60)
GLUCOSE: 134 mg/dL — AB (ref 65–99)
POTASSIUM: 3.2 mmol/L — AB (ref 3.5–5.1)
SODIUM: 136 mmol/L (ref 135–145)
TOTAL PROTEIN: 6.2 g/dL — AB (ref 6.5–8.1)

## 2015-11-05 LAB — SURGICAL PATHOLOGY

## 2015-11-05 NOTE — Progress Notes (Signed)
Surgery  POD 2  S/P open cholecystectomy  She is complaining of less abdominal pain. No nausea no vomiting no bowel movement or flatus. Pain seems well controlled on the existing PCA regimen. There is been no reported bile within the drain. With the help of a certified Spanish interpreter I interviewed the patient and answered all her questions today.  Filed Vitals:   11/05/15 1000 11/05/15 1251 11/05/15 1400 11/05/15 1522  BP:  135/63    Pulse:  111    Temp:      TempSrc:      Resp: 17 17 17 17   Height:      Weight:      SpO2: 93% 98% 95% 97%    PE: Lungs are clear, abdomen is soft. Dressings are dry. Jackson-Pratt drain is serosanguineous.    Labs  CBC Latest Ref Rng 11/04/2015 11/02/2015 08/22/2015  WBC 3.6 - 11.0 K/uL 15.4(H) 14.4(H) 10.6  Hemoglobin 12.0 - 16.0 g/dL 40.912.8 81.113.1 91.412.3  Hematocrit 35.0 - 47.0 % 37.4 38.7 34.2(L)  Platelets 150 - 440 K/uL 209 227 178   CMP Latest Ref Rng 11/05/2015 11/04/2015 11/02/2015  Glucose 65 - 99 mg/dL 782(N134(H) 562(Z140(H) 308(M128(H)  BUN 6 - 20 mg/dL 6 8 9   Creatinine 0.44 - 1.00 mg/dL 5.780.47 4.690.46 6.290.55  Sodium 135 - 145 mmol/L 136 136 136  Potassium 3.5 - 5.1 mmol/L 3.2(L) 3.8 3.7  Chloride 101 - 111 mmol/L 99(L) 105 102  CO2 22 - 32 mmol/L 32 26 28  Calcium 8.9 - 10.3 mg/dL 8.1(L) 8.1(L) 9.3  Total Protein 6.5 - 8.1 g/dL 6.2(L) - 7.9  Total Bilirubin 0.3 - 1.2 mg/dL 0.9 - 0.6  Alkaline Phos 38 - 126 U/L 71 - 49  AST 15 - 41 U/L 36 - 16  ALT 14 - 54 U/L 69(H) - 20    I/O last 3 completed shifts: In: 5323.8 [P.O.:120; I.V.:4740.8; Other:30; IV Piggyback:433] Out: 3292.5 [Urine:3275; Drains:17.5] Total I/O In: 2338 [P.O.:1050; I.V.:1288] Out: 3100 [Urine:3100]    IMP:  she is doing well.  Reversal resolved. I will continue her intravenous antibiotics for several more days.  Plan:  I explained to her the need for further intravenous antibiotics given the operative findings and recent fevers.  She will be discharged home with the drain.  I would leave it in at least 10 days. We will try a round of Toradol and start weaning the PCA tomorrow.

## 2015-11-05 NOTE — Anesthesia Postprocedure Evaluation (Signed)
  Anesthesia Post-op Note  Patient: Tammy PleasureDora Jaime Herrera  Procedure(s) Performed: Procedure(s): LAPAROSCOPIC CHOLECYSTECTOMY (N/A)  Anesthesia type:General  Patient location: PACU  Post pain: Pain level controlled  Post assessment: Post-op Vital signs reviewed, Patient's Cardiovascular Status Stable, Respiratory Function Stable, Patent Airway and No signs of Nausea or vomiting  Post vital signs: Reviewed and stable  Last Vitals:  Filed Vitals:   11/05/15 0841  BP:   Pulse:   Temp:   Resp: 18    Level of consciousness: awake, alert  and patient cooperative  Complications: No apparent anesthesia complications

## 2015-11-06 LAB — CBC
HCT: 30.3 % — ABNORMAL LOW (ref 35.0–47.0)
Hemoglobin: 10.6 g/dL — ABNORMAL LOW (ref 12.0–16.0)
MCH: 30.9 pg (ref 26.0–34.0)
MCHC: 34.9 g/dL (ref 32.0–36.0)
MCV: 88.4 fL (ref 80.0–100.0)
PLATELETS: 209 10*3/uL (ref 150–440)
RBC: 3.42 MIL/uL — ABNORMAL LOW (ref 3.80–5.20)
RDW: 13.3 % (ref 11.5–14.5)
WBC: 7.8 10*3/uL (ref 3.6–11.0)

## 2015-11-06 MED ORDER — ENOXAPARIN SODIUM 40 MG/0.4ML ~~LOC~~ SOLN
40.0000 mg | SUBCUTANEOUS | Status: DC
  Administered 2015-11-06: 40 mg via SUBCUTANEOUS
  Filled 2015-11-06: qty 0.4

## 2015-11-06 MED ORDER — OXYCODONE-ACETAMINOPHEN 5-325 MG PO TABS
1.0000 | ORAL_TABLET | Freq: Four times a day (QID) | ORAL | Status: DC | PRN
  Administered 2015-11-06 – 2015-11-07 (×4): 1 via ORAL
  Filled 2015-11-06 (×4): qty 1

## 2015-11-06 MED ORDER — KETOROLAC TROMETHAMINE 30 MG/ML IJ SOLN
30.0000 mg | Freq: Three times a day (TID) | INTRAMUSCULAR | Status: AC
  Administered 2015-11-06 – 2015-11-07 (×3): 30 mg via INTRAVENOUS
  Filled 2015-11-06 (×3): qty 1

## 2015-11-06 MED ORDER — MORPHINE SULFATE (PF) 2 MG/ML IV SOLN: 2.0000 mg | INTRAVENOUS | Status: DC | PRN

## 2015-11-06 NOTE — Progress Notes (Signed)
Patient remained hemodynamically stable through the night. She is asymptomatic ST on the monitor. She denied pain, N&V. Surgical incision dressing and site remained intact with old dry drainage. She demonstrated increase activity level tolerance. VS was WDL for patient. Patient has PCA and maintenance fluid infusion through the night. Patient was assisted with safety rounding and as needed .

## 2015-11-06 NOTE — Progress Notes (Signed)
Surgery  POD 3  S/P open cholecystectomy  She is feeling much better. No further nausea and vomiting pain seems well controlled she liked the result of her PCA and telemetry. She wants to start walking and she wants to eat. She was to know when she can go home. I spoke with the patient via a certified Spanish interpreter.  Filed Vitals:   11/06/15 0428 11/06/15 0600 11/06/15 1000 11/06/15 1235  BP: 124/56   130/70  Pulse: 98   97  Temp: 98.9 F (37.2 C)   98.8 F (37.1 C)  TempSrc: Oral   Oral  Resp: 20 16 20 19   Height:      Weight: 231 lb 9.6 oz (105.053 kg)     SpO2: 100% 100% 95% 97%    PE: Lungs are clear abdomen is soft dressings are dry. No bile in JP noted.  Labs  CBC Latest Ref Rng 11/06/2015 11/04/2015 11/02/2015  WBC 3.6 - 11.0 K/uL 7.8 15.4(H) 14.4(H)  Hemoglobin 12.0 - 16.0 g/dL 10.6(L) 12.8 13.1  Hematocrit 35.0 - 47.0 % 30.3(L) 37.4 38.7  Platelets 150 - 440 K/uL 209 209 227   CMP Latest Ref Rng 11/05/2015 11/04/2015 11/02/2015  Glucose 65 - 99 mg/dL 409(W134(H) 119(J140(H) 478(G128(H)  BUN 6 - 20 mg/dL 6 8 9   Creatinine 0.44 - 1.00 mg/dL 9.560.47 2.130.46 0.860.55  Sodium 135 - 145 mmol/L 136 136 136  Potassium 3.5 - 5.1 mmol/L 3.2(L) 3.8 3.7  Chloride 101 - 111 mmol/L 99(L) 105 102  CO2 22 - 32 mmol/L 32 26 28  Calcium 8.9 - 10.3 mg/dL 8.1(L) 8.1(L) 9.3  Total Protein 6.5 - 8.1 g/dL 6.2(L) - 7.9  Total Bilirubin 0.3 - 1.2 mg/dL 0.9 - 0.6  Alkaline Phos 38 - 126 U/L 71 - 49  AST 15 - 41 U/L 36 - 16  ALT 14 - 54 U/L 69(H) - 20    I/O last 3 completed shifts: In: 5582.4 [P.O.:1050; I.V.:4532.4] Out: 6735.5 [Urine:6725; Drains:10.5] Total I/O In: 2271 [P.O.:400; I.V.:1871] Out: 1200 [Urine:1200]    IMP: Doing well postop day #3. White count is now normal. No further fevers are noted.  Plan  we'll stop her PCA, discontinue telemetry. Advanced to regular diet. I will stop her antibiotics on postoperative day #4. Position to oral narcotics. Trial of Toradol.

## 2015-11-07 MED ORDER — NYSTATIN 100000 UNIT/ML MT SUSP
5.0000 mL | Freq: Four times a day (QID) | OROMUCOSAL | Status: DC
  Administered 2015-11-07: 500000 [IU] via ORAL
  Filled 2015-11-07: qty 5

## 2015-11-07 MED ORDER — NYSTATIN 100000 UNIT/ML MT SUSP: 5.0000 mL | Freq: Four times a day (QID) | OROMUCOSAL | Status: DC

## 2015-11-07 MED ORDER — ONDANSETRON HCL 4 MG PO TABS: 4.0000 mg | ORAL_TABLET | Freq: Three times a day (TID) | ORAL | Status: DC | PRN

## 2015-11-07 MED ORDER — OXYCODONE-ACETAMINOPHEN 5-325 MG PO TABS
1.0000 | ORAL_TABLET | Freq: Four times a day (QID) | ORAL | Status: DC | PRN
Start: 2015-11-07 — End: 2015-11-18

## 2015-11-07 NOTE — Discharge Instructions (Signed)
*  Monitor for signs of infection such as fever, drainage from your incision, and/or increasing pain not relieved from the prescribed medications.  *Take all medications as prescribed.  *Drink plenty of fluids. *Notify your doctor with any questions or concerns.

## 2015-11-07 NOTE — Discharge Summary (Signed)
Physician Discharge Summary  Patient ID: Tammy Herrera MRN: [id number] DOB/AGE: [birth year] 35 y.o.  Admit date: 11/02/2015 Discharge date: 11/07/2015  Admission Diagnoses: Acute cholecystitis  Discharge Diagnoses:  Active Problems:   Acute cholecystitis  gangrenous cholecystitis  Discharged Condition: Stable with Jackson-Pratt drain in place  Hospital Course: The patient was admitted with abdominal pain and a diagnosis of acute cholecystitis was made.  Laparoscopic converted to open cholecystectomy was performed on the 15th. Gangrenous cholecystitis was encountered. The operatively the patient did well. Her diet was able to be slowly advanced. Her pain was well-controlled with PCA pump. Jackson-Pratt drain was left in place. No bile was noted. By postoperative day #4 the patient was off of IV narcotics tolerating a regular diet was mobile enough to go home.  I'll in the hospital she was maintained on intravenous antibiotics. On the day of her discharge she did have some oral thrush for which she was started on nystatin's suspension.   Discharge Exam: Blood pressure 93/62, pulse 87, temperature 98.3 F (36.8 C), temperature source Oral, resp. rate 18, height 5\' 3"  (1.6 m), weight 231 lb 9.6 oz (105.053 kg), SpO2 97 %, unknown if currently breastfeeding. Abdomen was soft. No bile in drain.  Disposition: 01-Home or Self Care  Discharge Instructions    Call MD for:  persistant nausea and vomiting    Complete by:  As directed      Call MD for:  redness, tenderness, or signs of infection (pain, swelling, redness, odor or green/yellow discharge around incision site)    Complete by:  As directed      Call MD for:  severe uncontrolled pain    Complete by:  As directed      Call MD for:  temperature >100.4    Complete by:  As directed      Diet general    Complete by:  As directed      Discharge instructions    Complete by:  As directed   JP care and recording.  DISCHARGE  INSTRUCTIONS TO PATIENT  REMINDER:  Carry a list of your medications and allergies with you at all times Call your pharmacy at least 1 week in advance to refill prescriptions Do not mix any prescribed pain medicine with alcohol Do not drive any motor vehicles while taking pain medication. Take medications with food.  Do not retake a pain medication if you vomit after taking it.  Activity: no lifting more than 15 pounds until instructed by your doctor.   Dressing Care Instruction (if applicable):              Remove operative dressings in 48 hours.  May Shower-  Call office if any questions regarding this activity.  Dry Dressing as needed to operative site.  Drain care instructions provided to you in the hospital.   Follow-up appointments (date to return to physician): Call for appointment with Dr. Natale LayMark Adithi Gammon, MD at [phone] or [phone]  If need MD on call after hours and on weekends call Hospital operator at [phone] as ask to [facility].  Call Surgeon if you have: Temperature greater than 100.4 Persistent nausea and vomiting Severe uncontrolled pain Redness, tenderness, or signs of infection (pain, swelling, redness, odor or green/yellow discharge around the site) Difficulty breathing, headache or visual disturbances Hives Persistent dizziness or light-headedness Extreme fatigue Any other questions or concerns you may have after discharge  In an emergency, call 911 or go to  an Emergency Department at a nearby hospital  Diet:  Resume your usual diet.  Avoid spicy, greasy or heavy foods.  If you have nausea or vomiting, go back to liquids.  If you cannot keep liquids down, call your doctor.  Avoid alcohol consumption while on prescription pain medications. Good nutrition promotes healing. Increase fiber and fluids.     I understand and acknowledge receipt of the above instructions.                                                                                                                                         Patient or Guardian Signature                                                                    Date/Time                                                                                                                                        Physician's or R.N.'s Signature                                                                  Date/Time  The discharge instructions have been reviewed with the patient and/or Family Member/Parent/Guardian.  Patient and/or Family Member/Parent/Guardian signed and retained a printed copy.     Discharge wound care:    Complete by:  As directed   Keep Jp site clean, may sponge bathe while JP in place. Once JP removed may shower. JP will be removed in office next week.     Increase activity slowly    Complete by:  As directed             Medication List    TAKE these medications        ibuprofen 600 MG tablet  Commonly known as:  ADVIL,MOTRIN  Take 1 tablet (600 mg total) by mouth every 6 (six) hours.     multivitamin-prenatal 27-0.8 MG Tabs tablet  Take 1 tablet by mouth daily.     nystatin 100000 UNIT/ML suspension  Commonly known as:  MYCOSTATIN  Use as directed 5 mLs (500,000 Units total) in the mouth or throat 4 (four) times daily.     ondansetron 4 MG tablet  Commonly known as:  ZOFRAN  Take 1 tablet (4 mg total) by mouth every 8 (eight) hours as needed for nausea or vomiting.     oxyCODONE-acetaminophen 5-325 MG tablet  Commonly known as:  PERCOCET/ROXICET  Take 1-2 tablets by mouth every 6 (six) hours as needed for moderate pain (for pain scale greater than or equal to 4 and less than 7).           Follow-up Information    Follow up with Dionne Milo, MD On 11/10/2015.   Specialty:  Surgery   Why:  For suture removal and JP drain removal.   Contact information:   [address] [place] [address] [phone]        Signed: Natale Lay 11/07/2015, 11:53 AM

## 2015-11-07 NOTE — Final Progress Note (Signed)
Surgery   POD 4  S/P open cholecystectomy for gangrenous calculus cholecystitis.  The patient is feeling much better. Her main complaint is that of tongue pain. She is tolerating a regular diet had several bowel movements but no nausea no vomiting. She is interested in being discharged home today.  Filed Vitals:   11/06/15 2041 11/07/15 0010 11/07/15 0017 11/07/15 0528  BP: 113/59 112/50 170/106 93/62  Pulse: 93 84 104 87  Temp: 97.8 F (36.6 C) 98.2 F (36.8 C) 98.3 F (36.8 C) 98.3 F (36.8 C)  TempSrc: Oral Oral Oral Oral  Resp: 18 18 22 18   Height:      Weight:      SpO2: 97% 96% 94% 97%    PE:  Her dressings were removed. Her incision is clean dry and intact. There is no bile seen within the drain. There is no wound drainage.  Labs  CBC Latest Ref Rng 11/06/2015 11/04/2015 11/02/2015  WBC 3.6 - 11.0 K/uL 7.8 15.4(H) 14.4(H)  Hemoglobin 12.0 - 16.0 g/dL 10.6(L) 12.8 13.1  Hematocrit 35.0 - 47.0 % 30.3(L) 37.4 38.7  Platelets 150 - 440 K/uL 209 209 227   CMP Latest Ref Rng 11/05/2015 11/04/2015 11/02/2015  Glucose 65 - 99 mg/dL 454(U134(H) 981(X140(H) 914(N128(H)  BUN 6 - 20 mg/dL 6 8 9   Creatinine 0.44 - 1.00 mg/dL 8.290.47 5.620.46 1.300.55  Sodium 135 - 145 mmol/L 136 136 136  Potassium 3.5 - 5.1 mmol/L 3.2(L) 3.8 3.7  Chloride 101 - 111 mmol/L 99(L) 105 102  CO2 22 - 32 mmol/L 32 26 28  Calcium 8.9 - 10.3 mg/dL 8.1(L) 8.1(L) 9.3  Total Protein 6.5 - 8.1 g/dL 6.2(L) - 7.9  Total Bilirubin 0.3 - 1.2 mg/dL 0.9 - 0.6  Alkaline Phos 38 - 126 U/L 71 - 49  AST 15 - 41 U/L 36 - 16  ALT 14 - 54 U/L 69(H) - 20    I/O last 3 completed shifts: In: 6188.4 [P.O.:620; I.V.:5563.4; Other:5] Out: 4600 [Urine:4600] Total I/O In: 88 [I.V.:88] Out: 5 [Drains:5]    IMP  overall she is doing very well on postoperative day #4 status post an open cholecystectomy. She is stable and improved for discharge. She does have some oral candidiasis for which we will start her on nystatin swish and  spit.  Plan:  I will start her on some nystatin swish and spit today. Her IV antibiotics were discontinued today. She will be discharged home with drain in place and this can be removed next week. She is to call with any questions or concerns. Instructions were provided to her in Spanish. A certified Spanish interpreter she is during her discharge process.

## 2015-11-07 NOTE — Progress Notes (Signed)
MD ordered patient to be discharged home.  Discharge instructions were reviewed with the patient and husband through the interpreter, Damian Leavellrudy.  Patient instructed about calling and making her follow-up appointment with Dr. Phineas RealBirds office.  Prescriptions were given to the patient.  Patient instructed on taking care of her JP drain. IV was removed with catheter intact. All patient's questions were answered.  Patient left via wheelchair escorted by nursing and family.

## 2015-11-09 ENCOUNTER — Telehealth: Payer: Self-pay

## 2015-11-09 NOTE — Telephone Encounter (Signed)
Post-discharge call made to patient at this time. No answer. Answering machine was in spanish- unable to understand what this recording said.  Called interpreter services for help with this call using the Language line. Interpreter states that this recording is saying that the person trying to be reached is unavailable and please try your call again at a later time.   Unable to leave voicemail.

## 2015-11-10 ENCOUNTER — Encounter: Payer: Self-pay | Admitting: *Deleted

## 2015-11-11 ENCOUNTER — Ambulatory Visit (INDEPENDENT_AMBULATORY_CARE_PROVIDER_SITE_OTHER): Payer: Medicaid Other | Admitting: Surgery

## 2015-11-11 ENCOUNTER — Encounter: Payer: Self-pay | Admitting: Surgery

## 2015-11-11 VITALS — BP 129/67 | HR 90 | Temp 98.5°F | Ht 64.0 in | Wt 212.0 lb

## 2015-11-11 DIAGNOSIS — K8000 Calculus of gallbladder with acute cholecystitis without obstruction: Secondary | ICD-10-CM

## 2015-11-11 NOTE — Progress Notes (Signed)
Outpatient postop visit  11/11/2015  Tammy CaprioDora Jaime Herrera is an 34 y.o. female.    Procedure: Lap converted to open cholecystectomy  CC: Moderate pain  HPI: Moderate pain tolerating diet having normal bowel movements no fevers or chills. Drain is putting out very minimal serous fluid  Medications reviewed.    Physical Exam:  BP 129/67 mmHg  Pulse 90  Temp(Src) 98.5 F (36.9 C) (Oral)  Ht 5\' 4"  (1.626 m)  Wt 212 lb (96.163 kg)  BMI 36.37 kg/m2    PE: No icterus no jaundice soft nontender abdomen staples in place without erythema or drainage JP drain shows serous fluid no bile    Assessment/Plan:  JP drain is removed. Refilled Percocet. Patient follow-up next week for removal of the remaining staples half were removed today  Lattie Hawichard E Shadara Lopez, MD, FACS

## 2015-11-18 ENCOUNTER — Ambulatory Visit (INDEPENDENT_AMBULATORY_CARE_PROVIDER_SITE_OTHER): Payer: Self-pay | Admitting: Surgery

## 2015-11-18 ENCOUNTER — Encounter: Payer: Self-pay | Admitting: Surgery

## 2015-11-18 VITALS — BP 118/77 | HR 75 | Temp 98.3°F | Ht 64.0 in | Wt 214.2 lb

## 2015-11-18 DIAGNOSIS — K810 Acute cholecystitis: Secondary | ICD-10-CM

## 2015-11-18 MED ORDER — OXYCODONE-ACETAMINOPHEN 5-325 MG PO TABS: 1.0000 | ORAL_TABLET | Freq: Four times a day (QID) | ORAL | Status: DC | PRN

## 2015-11-18 NOTE — Progress Notes (Signed)
34 yr old female s/p Lap converted to open chole on 11/15 for acute gangrenous cholecystitis.  Patient states feeling better but still having some soreness around incision site.  She states that her appetite is improving but that it is not back to normal yet.  She is not having any nausea with eating, just gets full quickly.  She states sometimes she gets a spasm pain in RUQ and back after she has been moving around all day.  She denies any fever, chills or increase in pain.    Filed Vitals:   11/18/15 1616  BP: 118/77  Pulse: 75  Temp: 98.3 F (36.8 C)   PE:  Gen: NAD Abd: Soft, incision sites c/d/i, staples removed, some edema around RUQ incision but no erythema or fluctuance  A/p: discussed with the patient that she is improving well after this operation. Discussed that she should continue to slowly having improvement in appetite, soreness and energy over the next 3 weeks but that it would take that long to fully recover.  She can start using ibuprofen and tylenol for pain but gave her a refill of percocet 20 tabs for breakthrough pain.  Will have her f/u in 2-3 weeks to make sure still improving.

## 2015-11-18 NOTE — Patient Instructions (Addendum)
Recomiendo que tome Miralax que lo puede comprar sin receta en cualquier farmacia. Tomeselo cada 6 horas doz veces al dia. Si esto no le ayuda al hacer del bano, compre Dulcolax y tomese doz dosis. Tambien lo puede comprar en la farmacia sin receta.  Si esto no le ayuda, llamenos por favor.

## 2015-12-07 ENCOUNTER — Encounter (INDEPENDENT_AMBULATORY_CARE_PROVIDER_SITE_OTHER): Payer: Self-pay

## 2015-12-07 ENCOUNTER — Encounter: Payer: Self-pay | Admitting: General Surgery

## 2015-12-07 ENCOUNTER — Other Ambulatory Visit
Admission: RE | Admit: 2015-12-07 | Discharge: 2015-12-07 | Disposition: A | Discharge status: Home / Self Care | Payer: Medicaid Other | Source: Ambulatory Visit | Attending: Surgery | Admitting: Surgery

## 2015-12-07 ENCOUNTER — Ambulatory Visit (INDEPENDENT_AMBULATORY_CARE_PROVIDER_SITE_OTHER): Payer: Self-pay | Admitting: General Surgery

## 2015-12-07 VITALS — BP 114/69 | HR 72 | Temp 98.9°F | Ht 64.0 in | Wt 221.0 lb

## 2015-12-07 DIAGNOSIS — R1011 Right upper quadrant pain: Secondary | ICD-10-CM | POA: Insufficient documentation

## 2015-12-07 DIAGNOSIS — Z4889 Encounter for other specified surgical aftercare: Secondary | ICD-10-CM | POA: Insufficient documentation

## 2015-12-07 LAB — COMPREHENSIVE METABOLIC PANEL
ALBUMIN: 4 g/dL (ref 3.5–5.0)
ALK PHOS: 60 U/L (ref 38–126)
ALT: 16 U/L (ref 14–54)
AST: 15 U/L (ref 15–41)
Anion gap: 7 (ref 5–15)
BILIRUBIN TOTAL: 0.4 mg/dL (ref 0.3–1.2)
BUN: 12 mg/dL (ref 6–20)
CO2: 29 mmol/L (ref 22–32)
Calcium: 9.3 mg/dL (ref 8.9–10.3)
Chloride: 102 mmol/L (ref 101–111)
Creatinine, Ser: 0.63 mg/dL (ref 0.44–1.00)
GFR calc Af Amer: >60 mL/min (ref >60)
GFR calc non Af Amer: >60 mL/min (ref >60)
GLUCOSE: 106 mg/dL — AB (ref 65–99)
POTASSIUM: 3.7 mmol/L (ref 3.5–5.1)
SODIUM: 138 mmol/L (ref 135–145)
TOTAL PROTEIN: 7.2 g/dL (ref 6.5–8.1)

## 2015-12-07 LAB — CBC WITH DIFFERENTIAL/PLATELET
BASOS ABS: 0 10*3/uL (ref 0–0.1)
Basophils Relative: 0 %
EOS PCT: 1 %
Eosinophils Absolute: 0.1 10*3/uL (ref 0–0.7)
HEMATOCRIT: 36.4 % (ref 35.0–47.0)
Hemoglobin: 12.3 g/dL (ref 12.0–16.0)
LYMPHS PCT: 39 %
Lymphs Abs: 3.1 10*3/uL (ref 1.0–3.6)
MCH: 29.4 pg (ref 26.0–34.0)
MCHC: 33.8 g/dL (ref 32.0–36.0)
MCV: 87 fL (ref 80.0–100.0)
MONO ABS: 0.4 10*3/uL (ref 0.2–0.9)
MONOS PCT: 5 %
Neutro Abs: 4.4 10*3/uL (ref 1.4–6.5)
Neutrophils Relative %: 55 %
PLATELETS: 186 10*3/uL (ref 150–440)
RBC: 4.18 MIL/uL (ref 3.80–5.20)
RDW: 14.3 % (ref 11.5–14.5)
WBC: 8.1 10*3/uL (ref 3.6–11.0)

## 2015-12-07 MED ORDER — OXYCODONE-ACETAMINOPHEN 5-325 MG PO TABS: 1.0000 | ORAL_TABLET | Freq: Four times a day (QID) | ORAL | Status: AC | PRN

## 2015-12-07 NOTE — Progress Notes (Signed)
Outpatient Surgical Follow Up  12/07/2015  Tammy Herrera is an 34 y.o. female.   Chief Complaint  Patient presents with  . Routine Post Op    Open Cholecystectomy (11/15)- Dr. Egbert GaribaldiBird    HPI:  34 year old female returns to clinic 1  Month status post open cholecystectomy for gangrenous cholecystitis. Patient states that she continues to have intermittent discomfort as well as loss of appetite , subjective fevers and decreased energy. She has been able to eat without nausea and is been having normal bowel function. Her primary complaint is of discomfort that she was through her to her back from her right upper quadrant. She denies any fevers, chills, chest pain, shortness of breath , nausea, vomiting, diarrhea, constipation.  Past Medical History  Diagnosis Date  . Depression   . Obesity   . Cholelithiasis     Past Surgical History  Procedure Laterality Date  . Tubal ligation Bilateral 08/22/2015    Procedure: POST PARTUM TUBAL LIGATION;  Surgeon: Suzy Bouchardhomas J Schermerhorn, MD;  Location: [facility] ORS;  Service: Gynecology;  Laterality: Bilateral;  . Cholecystectomy N/A 11/03/2015    Procedure: LAPAROSCOPIC CHOLECYSTECTOMY;  Surgeon: Natale LayMark Bird, MD;  Location: [facility] ORS;  Service: General;  Laterality: N/A;    Family History  Problem Relation Age of Onset  . Hypotension Mother   . Otitis media Father     Social History:  reports that she has never smoked. She has never used smokeless tobacco. She reports that she does not drink alcohol or use illicit drugs.  Allergies:  Allergies  Allergen Reactions  . Penicillins Hives    Medications reviewed.    ROS  a multipoint review of systems was completed. All pertinent positives and negatives within the history of present illness the remainder were negative.   BP 114/69 mmHg  Pulse 72  Temp(Src) 98.9 F (37.2 C) (Oral)  Ht 5\' 4"  (1.626 m)  Wt 100.245 kg (221 lb)  BMI 37.92 kg/m2  LMP 11/30/2015  Physical Exam   Gen.: No  acute distress Chest: Clear to auscultation Heart: Regular rhythm Abdomen: Soft, nondistended, well-healed open cholecystectomy incision. Tender to palpation along the entirety of the incision and lateral to it in the right upper quadrant. No evidence of infection or peritoneal signs.   No results found for this or any previous visit (from the past 48 hour(s)). No results found.  Assessment/Plan:  1. Abdominal pain, right upper quadrant  pain is most likely secondary to her surgery. However given that she had gangrenous cholecystitis will obtain labs for further evaluation. Should her labs be abnormal we will then obtain a CT scan. We will call her with the results of her labs in the morning. - CBC with Differential - Comprehensive metabolic panel  2. Aftercare following surgery  incision site appears to be healing well. Discussed appropriate wound care at this time. She'll follow up as needed.     Ricarda Frameharles Britain Anagnos, MD FACS General Surgeon  12/07/2015,4:04 PM

## 2015-12-07 NOTE — Patient Instructions (Signed)
Please go downstairs to the lab and have your labs drawn before leaving today.  We will call you tomorrow with the results. If they are abnormal, we will get a CT scan to see what further is going on.

## 2015-12-08 ENCOUNTER — Telehealth: Payer: Self-pay

## 2015-12-08 NOTE — Telephone Encounter (Signed)
Called patient to let her know that her lab results were normal. Patient understood. I also told patient that Dr. Tonita CongWoodham wanted her to be seen in two weeks with Dr. Orvis BrillLoflin. Appointment will be on 12/24/2015 [facility] 3[facility]. Patient understood and had no further questions.

## 2015-12-24 ENCOUNTER — Ambulatory Visit (INDEPENDENT_AMBULATORY_CARE_PROVIDER_SITE_OTHER): Payer: Self-pay | Admitting: Surgery

## 2015-12-24 ENCOUNTER — Encounter: Payer: Self-pay | Admitting: Surgery

## 2015-12-24 VITALS — BP 120/77 | HR 87 | Temp 97.8°F | Ht 64.0 in | Wt 221.0 lb

## 2015-12-24 DIAGNOSIS — K810 Acute cholecystitis: Secondary | ICD-10-CM

## 2015-12-24 DIAGNOSIS — Z9049 Acquired absence of other specified parts of digestive tract: Secondary | ICD-10-CM

## 2015-12-24 NOTE — Patient Instructions (Signed)
We will see you back in 4 weeks and see how you are doing. If you are still having pain at this time, the plan will be to do a CT scan.

## 2015-12-24 NOTE — Progress Notes (Signed)
35 yr old female, with Open chole back in November 2016. Conducted visit with an interpreter. She is still complaining of pain and burning in right upper quadrant that comes and goes and constant pain to her back.  She has not noticed turning yellow, denies any fever, chills, nausea or vomiting.  She is having about 2 stools per day.    Filed Vitals:   12/24/15 1538  BP: 120/77  Pulse: 87  Temp: 97.8 F (36.6 C)     PE:  Gen:NAD Res: CTAB/L  Cardio: RRR Abd: RUQ incision site with pain and tenderness even to light palpation, some bulging superior to incision, no erythema no mass, other incision sites no pain healing well.    CBC    Component Value Date/Time   WBC 8.1 12/07/2015 1626   RBC 4.18 12/07/2015 1626   HGB 12.3 12/07/2015 1626   HCT 36.4 12/07/2015 1626   PLT 186 12/07/2015 1626   MCV 87.0 12/07/2015 1626   MCH 29.4 12/07/2015 1626   MCHC 33.8 12/07/2015 1626   RDW 14.3 12/07/2015 1626   LYMPHSABS 3.1 12/07/2015 1626   MONOABS 0.4 12/07/2015 1626   EOSABS 0.1 12/07/2015 1626   BASOSABS 0.0 12/07/2015 1626   CMP Latest Ref Rng 12/07/2015 11/05/2015 11/04/2015  Glucose 65 - 99 mg/dL 914(N106(H) 829(F134(H) 621(H140(H)  BUN 6 - 20 mg/dL 12 6 8   Creatinine 0.44 - 1.00 mg/dL 0.860.63 5.780.47 4.690.46  Sodium 135 - 145 mmol/L 138 136 136  Potassium 3.5 - 5.1 mmol/L 3.7 3.2(L) 3.8  Chloride 101 - 111 mmol/L 102 99(L) 105  CO2 22 - 32 mmol/L 29 32 26  Calcium 8.9 - 10.3 mg/dL 9.3 6.2(X8.1(L) 8.1(L)  Total Protein 6.5 - 8.1 g/dL 7.2 6.2(L) -  Total Bilirubin 0.3 - 1.2 mg/dL 0.4 0.9 -  Alkaline Phos 38 - 126 U/L 60 71 -  AST 15 - 41 U/L 15 36 -  ALT 14 - 54 U/L 16 69(H) -    A/P:  35 yr old s/p open chole,  She is healing well but still tired and with pain, labs reviewed and normal.  Given that she is still having pain will have her f/u in 4 weeks to see if just hyperalgesia around wound.  If still not improved will get CT scan.

## 2016-01-27 ENCOUNTER — Encounter: Payer: Self-pay | Admitting: Surgery

## 2018-07-12 ENCOUNTER — Other Ambulatory Visit: Payer: Self-pay

## 2018-07-12 ENCOUNTER — Encounter: Payer: Self-pay | Admitting: Emergency Medicine

## 2018-07-12 ENCOUNTER — Observation Stay
Admission: EM | Admit: 2018-07-12 | Discharge: 2018-07-14 | Disposition: A | Discharge status: Home / Self Care | Payer: Medicaid Other | Attending: Obstetrics and Gynecology | Admitting: Obstetrics and Gynecology

## 2018-07-12 DIAGNOSIS — I959 Hypotension, unspecified: Secondary | ICD-10-CM | POA: Insufficient documentation

## 2018-07-12 DIAGNOSIS — N920 Excessive and frequent menstruation with regular cycle: Secondary | ICD-10-CM | POA: Insufficient documentation

## 2018-07-12 DIAGNOSIS — D649 Anemia, unspecified: Secondary | ICD-10-CM | POA: Diagnosis present

## 2018-07-12 DIAGNOSIS — R079 Chest pain, unspecified: Secondary | ICD-10-CM

## 2018-07-12 DIAGNOSIS — Y838 Other surgical procedures as the cause of abnormal reaction of the patient, or of later complication, without mention of misadventure at the time of the procedure: Secondary | ICD-10-CM | POA: Insufficient documentation

## 2018-07-12 DIAGNOSIS — R0682 Tachypnea, not elsewhere classified: Secondary | ICD-10-CM | POA: Diagnosis not present

## 2018-07-12 DIAGNOSIS — T8092XA Unspecified transfusion reaction, initial encounter: Secondary | ICD-10-CM | POA: Diagnosis not present

## 2018-07-12 DIAGNOSIS — D500 Iron deficiency anemia secondary to blood loss (chronic): Secondary | ICD-10-CM | POA: Diagnosis not present

## 2018-07-12 HISTORY — DX: Anemia, unspecified: D64.9

## 2018-07-12 LAB — CBC WITH DIFFERENTIAL/PLATELET
BASOS ABS: 0 10*3/uL (ref 0–0.1)
Basophils Relative: 1 %
EOS PCT: 1 %
Eosinophils Absolute: 0 10*3/uL (ref 0–0.7)
HCT: 20.8 % — ABNORMAL LOW (ref 35.0–47.0)
Hemoglobin: 6.2 g/dL — ABNORMAL LOW (ref 12.0–16.0)
LYMPHS ABS: 2 10*3/uL (ref 1.0–3.6)
LYMPHS PCT: 26 %
MCH: 18.6 pg — AB (ref 26.0–34.0)
MCHC: 29.8 g/dL — ABNORMAL LOW (ref 32.0–36.0)
MCV: 62.6 fL — AB (ref 80.0–100.0)
Monocytes Absolute: 0.4 10*3/uL (ref 0.2–0.9)
Monocytes Relative: 5 %
Neutro Abs: 5.5 10*3/uL (ref 1.4–6.5)
Neutrophils Relative %: 69 %
PLATELETS: 282 10*3/uL (ref 150–440)
RBC: 3.33 MIL/uL — ABNORMAL LOW (ref 3.80–5.20)
RDW: 19.8 % — AB (ref 11.5–14.5)
WBC: 8 10*3/uL (ref 3.6–11.0)

## 2018-07-12 LAB — PREPARE RBC (CROSSMATCH)

## 2018-07-12 MED ORDER — ZOLPIDEM TARTRATE 5 MG PO TABS: 5.0000 mg | ORAL_TABLET | Freq: Every evening | ORAL | Status: DC | PRN

## 2018-07-12 MED ORDER — HYDROMORPHONE HCL 1 MG/ML IJ SOLN: 0.2000 mg | INTRAMUSCULAR | Status: DC | PRN

## 2018-07-12 MED ORDER — LACTATED RINGERS IV SOLN
INTRAVENOUS | Status: DC
  Administered 2018-07-12 – 2018-07-13 (×2): via INTRAVENOUS

## 2018-07-12 MED ORDER — MAGNESIUM HYDROXIDE 400 MG/5ML PO SUSP: 30.0000 mL | Freq: Every day | ORAL | Status: DC | PRN

## 2018-07-12 MED ORDER — OXYCODONE-ACETAMINOPHEN 5-325 MG PO TABS
1.0000 | ORAL_TABLET | ORAL | Status: DC | PRN
  Administered 2018-07-13: 1 via ORAL
  Filled 2018-07-12: qty 1

## 2018-07-12 MED ORDER — TRANEXAMIC ACID 650 MG PO TABS
1300.0000 mg | ORAL_TABLET | Freq: Once | ORAL | Status: AC
  Administered 2018-07-12: 1300 mg via ORAL
  Filled 2018-07-12: qty 2

## 2018-07-12 MED ORDER — ONDANSETRON HCL 4 MG/2ML IJ SOLN: 4.0000 mg | Freq: Four times a day (QID) | INTRAMUSCULAR | Status: DC | PRN

## 2018-07-12 MED ORDER — IBUPROFEN 600 MG PO TABS: 600.0000 mg | ORAL_TABLET | Freq: Four times a day (QID) | ORAL | Status: DC | PRN

## 2018-07-12 MED ORDER — ACETAMINOPHEN 325 MG PO TABS
650.0000 mg | ORAL_TABLET | Freq: Once | ORAL | Status: AC
  Administered 2018-07-12: 650 mg via ORAL
  Filled 2018-07-12: qty 2

## 2018-07-12 MED ORDER — ALUM & MAG HYDROXIDE-SIMETH 200-200-20 MG/5ML PO SUSP: 30.0000 mL | ORAL | Status: DC | PRN

## 2018-07-12 MED ORDER — BISACODYL 5 MG PO TBEC: 5.0000 mg | DELAYED_RELEASE_TABLET | Freq: Every day | ORAL | Status: DC | PRN

## 2018-07-12 MED ORDER — MAGNESIUM CITRATE PO SOLN
1.0000 | Freq: Once | ORAL | Status: DC | PRN
Start: 2018-07-12 — End: 2018-07-14
  Filled 2018-07-12: qty 296

## 2018-07-12 MED ORDER — ONDANSETRON HCL 4 MG PO TABS
4.0000 mg | ORAL_TABLET | Freq: Four times a day (QID) | ORAL | Status: DC | PRN
  Filled 2018-07-12: qty 1

## 2018-07-12 MED ORDER — DIPHENHYDRAMINE HCL 25 MG PO CAPS
25.0000 mg | ORAL_CAPSULE | Freq: Once | ORAL | Status: AC
  Administered 2018-07-12: 25 mg via ORAL
  Filled 2018-07-12: qty 1

## 2018-07-12 MED ORDER — SODIUM CHLORIDE 0.9% IV SOLUTION
Freq: Once | INTRAVENOUS | Status: AC
  Administered 2018-07-12: 20:00:00 via INTRAVENOUS
  Filled 2018-07-12: qty 250

## 2018-07-12 MED ORDER — DOCUSATE SODIUM 100 MG PO CAPS
100.0000 mg | ORAL_CAPSULE | Freq: Two times a day (BID) | ORAL | Status: DC
  Administered 2018-07-13 – 2018-07-14 (×2): 100 mg via ORAL
  Filled 2018-07-12 (×4): qty 1

## 2018-07-12 NOTE — ED Notes (Signed)
Spoke with pt about wait times and what to expect next. Advised pt that I am available for further questions if needed.  

## 2018-07-12 NOTE — ED Notes (Signed)
Pt denies new weakness - states she is usually weak with the anemia but nothing abnormal at this time. Is on iron pills but states they upset her stomach and she is talking to the dr regarding iron transfusions. No dizziness.

## 2018-07-12 NOTE — ED Notes (Addendum)
H&h per [facility] lab results is 5.9 and 20.9

## 2018-07-12 NOTE — ED Notes (Signed)
Patient transported to room 347

## 2018-07-12 NOTE — ED Triage Notes (Signed)
[facility]&h. Has menorrhagia and gets her levels checked for anemia. Today the levels were lower than her usual

## 2018-07-12 NOTE — Progress Notes (Signed)
Spoke with Dr. Dalbert GarnetBeasley. Okay for patient to go to [facility].

## 2018-07-12 NOTE — ED Notes (Signed)
Blood consent signed on paper due to signature pad not working in the room. Interpreter at bedside. Pt given blankets. No bed assigned yet at this time.

## 2018-07-12 NOTE — ED Provider Notes (Addendum)
[facility] Emergency Department Provider Note  ____________________________________________   First MD Initiated Contact with Patient 07/12/18 1504     (approximate)  I have reviewed the triage vital signs and the nursing notes.   HISTORY  Chief Complaint Menorrhagia   HPI Tammy Herrera is a 37 y.o. female who sent to the emergency department from [place] [facility] for low hemoglobin.  She has been followed by [facility]. Dalbert Garnet [facility] for chronic menorrhagia that is been difficult to manage.  She has never had a blood transfusion.  She has tried multiple hormones without success.  She is currently menstruating.  She has moderate severity cramping lower abdominal pain but nothing seems to make better or worse.  She does have some exertional shortness of breath although no chest pain.   Past Medical History:  Diagnosis Date  . Anemia   . Cholelithiasis   . Depression   . Obesity     Patient Active Problem List   Diagnosis Date Noted  . Symptomatic anemia 07/12/2018  . Aftercare following surgery 12/07/2015  . Acute cholecystitis 11/02/2015  . Postpartum state 08/22/2015  . Normal labor and delivery 08/21/2015  . Blood in urine 07/08/2015    Past Surgical History:  Procedure Laterality Date  . CHOLECYSTECTOMY N/A 11/03/2015   Procedure: LAPAROSCOPIC CHOLECYSTECTOMY;  Surgeon: Natale Lay, MD;  Location: [facility] ORS;  Service: General;  Laterality: N/A;  . TUBAL LIGATION Bilateral 08/22/2015   Procedure: POST PARTUM TUBAL LIGATION;  Surgeon: Suzy Bouchard, MD;  Location: [facility] ORS;  Service: Gynecology;  Laterality: Bilateral;    Prior to Admission medications   Medication Sig Start Date End Date Taking? Authorizing Provider  ferrous sulfate 325 (65 FE) MG tablet Take 1 tablet (325 mg total) by mouth 2 (two) times daily with a meal. Take with Vitamin C 07/14/18 09/12/18  Christeen Douglas, MD  oxyCODONE-acetaminophen (ROXICET) 5-325 MG  tablet Take 1 tablet by mouth every 6 (six) hours as needed for severe pain. Patient not taking: Reported on 07/12/2018 12/07/15   Ricarda Frame, MD    Allergies Penicillins  Family History  Problem Relation Age of Onset  . Hypotension Mother   . CAD Mother   . Otitis media Father     Social History Social History   Tobacco Use  . Smoking status: Never Smoker  . Smokeless tobacco: Never Used  Substance Use Topics  . Alcohol use: No  . Drug use: No    Review of Systems Constitutional: No fever/chills ENT: No sore throat. Cardiovascular: Denies chest pain. Respiratory: Positive for shortness of breath. Gastrointestinal: Positive for abdominal pain.  No nausea, no vomiting.  No diarrhea.  No constipation. Musculoskeletal: Negative for back pain. Neurological: Negative for headaches   ____________________________________________   PHYSICAL EXAM:  VITAL SIGNS: ED Triage Vitals  Enc Vitals Group     BP 07/12/18 1220 140/64     Pulse Rate 07/12/18 1220 (!) 101     Resp 07/12/18 1220 16     Temp 07/12/18 1220 99 F (37.2 C)     Temp Source 07/12/18 1220 Oral     SpO2 07/12/18 1220 100 %     Weight 07/12/18 1223 215 lb (97.5 kg)     Height 07/12/18 1223 5\' 5"  (1.651 m)     Head Circumference --      Peak Flow --      Pain Score 07/12/18 1223 0     Pain Loc --  Pain Edu? --      Excl. in GC? --     Constitutional: Alert and oriented x4 nontoxic no diaphoresis Head: Atraumatic.  Conjunctival pallor Nose: No congestion/rhinnorhea. Mouth/Throat: No trismus Neck: No stridor.   Cardiovascular: Regular rate and rhythm Respiratory: Normal respiratory effort.  No retractions. Gastrointestinal: Soft nontender Neurologic:  Normal speech and language. No gross focal neurologic deficits are appreciated.  Skin:  Skin is warm, dry and intact. No rash noted.    ____________________________________________  LABS (all labs ordered are listed, but only abnormal  results are displayed)  Labs Reviewed  URINE CULTURE - Abnormal; Notable for the following components:      Result Value   Culture MULTIPLE SPECIES PRESENT, SUGGEST RECOLLECTION (*)    All other components within normal limits  CBC WITH DIFFERENTIAL/PLATELET - Abnormal; Notable for the following components:   RBC 3.33 (*)    Hemoglobin 6.2 (*)    HCT 20.8 (*)    MCV 62.6 (*)    MCH 18.6 (*)    MCHC 29.8 (*)    RDW 19.8 (*)    All other components within normal limits  HEMOGLOBIN AND HEMATOCRIT, BLOOD - Abnormal; Notable for the following components:   Hemoglobin 8.9 (*)    HCT 27.8 (*)    All other components within normal limits  CBC - Abnormal; Notable for the following components:   Hemoglobin 9.5 (*)    HCT 29.9 (*)    MCV 68.6 (*)    MCH 21.8 (*)    MCHC 31.8 (*)    RDW 23.6 (*)    All other components within normal limits  URINALYSIS, COMPLETE (UACMP) WITH MICROSCOPIC - Abnormal; Notable for the following components:   Color, Urine YELLOW (*)    APPearance HAZY (*)    Hgb urine dipstick SMALL (*)    Leukocytes, UA LARGE (*)    WBC, UA >50 (*)    Bacteria, UA MANY (*)    All other components within normal limits  HEMOGLOBIN - Abnormal; Notable for the following components:   Hemoglobin 8.9 (*)    All other components within normal limits  BASIC METABOLIC PANEL - Abnormal; Notable for the following components:   Glucose, Bld 103 (*)    Calcium 8.6 (*)    Anion gap 4 (*)    All other components within normal limits  TROPONIN I  TROPONIN I  TROPONIN I  BRAIN NATRIURETIC PEPTIDE  TYPE AND SCREEN  PREPARE RBC (CROSSMATCH)  PREPARE RBC (CROSSMATCH)  TRANSFUSION REACTION    Lab work reviewed by me with iron deficiency anemia __________________________________________  EKG  ____________________________________________  RADIOLOGY   ____________________________________________   DIFFERENTIAL includes but not limited to  Menorrhagia, metromenorrhagia,  vaginal laceration, hormone imbalance, ectopic pregnancy  PROCEDURES  Procedure(s) performed: no  .Critical Care Performed by: Merrily Brittleifenbark, Atsushi Yom, MD Authorized by: Merrily Brittleifenbark, Joshoa Shawler, MD   Critical care provider statement:    Critical care time (minutes):  30   Critical care time was exclusive of:  Separately billable procedures and treating other patients   Critical care was necessary to treat or prevent imminent or life-threatening deterioration of the following conditions: anemia.   Critical care was time spent personally by me on the following activities:  Development of treatment plan with patient or surrogate, discussions with consultants, evaluation of patient's response to treatment, examination of patient, obtaining history from patient or surrogate, ordering and performing treatments and interventions, ordering and review of laboratory studies, ordering and review  of radiographic studies, pulse oximetry, re-evaluation of patient's condition and review of old charts    Critical Care performed: no  ____________________________________________   INITIAL IMPRESSION / ASSESSMENT AND PLAN / ED COURSE  Pertinent labs & imaging results that were available during my care of the patient were reviewed by me and considered in my medical decision making (see chart for details).   As part of my medical decision making, I reviewed the following data within the electronic MEDICAL RECORD NUMBER History obtained from family if available, nursing notes, old chart and ekg, as well as notes from prior ED visits.  I discussed with the patient's OB gynecologist Dr. Dalbert Garnet who agrees with inpatient admission for blood transfusion.  Of given her first dose of tranexamic acid orally now.  She is not pregnant do not suspect ectopic pregnancy.      ____________________________________________   FINAL CLINICAL IMPRESSION(S) / ED DIAGNOSES  Final diagnoses:  Chest pain  Complication of blood transfusion        NEW MEDICATIONS STARTED DURING THIS VISIT:  Discharge Medication List as of 07/14/2018  4:47 PM    START taking these medications   Details  ferrous sulfate 325 (65 FE) MG tablet Take 1 tablet (325 mg total) by mouth 2 (two) times daily with a meal. Take with Vitamin C, Starting Sat 07/14/2018, Until Wed 09/12/2018, Print         Note:  This document was prepared using Dragon voice recognition software and may include unintentional dictation errors.      Merrily Brittle, MD 07/16/18 1226    Merrily Brittle, MD 07/21/18 2202

## 2018-07-12 NOTE — H&P (Signed)
History and Physical   SERVICE: Gynecology   Patient Name: Tammy Herrera Patient MRN:   [id number]  CC: Menorrhagia  HPI: Tammy Herrera is a 37 y.o. [id number] with symptomatic anemia and menorrhagia, initially controlled this year with OCPs. She was seen as an outpatient last week with a hgb of 6.8. We decided on IUD, but as she is self-pay we tried to get her to the health department or [facility] [employer] to try and get it subsidized.   She does really want to avoid surgery if possible.  She came today for repeat hgb which returned at 5.9, and she was sent by outpatient hematology directly to the ER.  She endorses dizziness, fatigue, headache and palpatations. Her menstrual cycle comes q2 weeks and is very heavy flow.  She is present today with her family.  Out patient labs are below.    07/12/2018 07/02/2018 06/28/2018 04/03/2018 02/12/2018   9.1  7.8 7.0 7.1  3.11 (L)  3.15 (L) 4.14 2.77 (L)  5.9 (LL) 6.8 (L) 6.1 (L) 10.2 (L) 7.2 (L)  20.9 (L)  22.4 (L) 33.7 (L) 23.3 (L)  67.2 (L)  71.1 (L) 81.4 84.1  19.0 (L)  19.4 (L) 24.6 (L) 26.0 (L)  28.2 (L)  27.2 (L) 30.3 (L) 30.9 (L)  18.3 (H)  18.1 (H) 15.5 (H) 15.2 (H)  10.9  11.8 10.7 10.2  0.03  0.02 0.01 0.03       Review of Systems: positives in bold GEN:   fevers, chills, weight changes, appetite changes, fatigue, night sweats HEENT:  HA, vision changes, hearing loss, congestion, rhinorrhea, sinus pressure, dysphagia CV:   CP, palpitations PULM:  SOB, cough GI:  abd pain, N/V/D/C GU:  dysuria, urgency, frequency MSK:  arthralgias, myalgias, back pain, swelling SKIN:  rashes, color changes, pallor NEURO:  numbness, weakness, tingling, seizures, dizziness, tremors PSYCH:  depression, anxiety, behavioral problems, confusion  HEME/LYMPH:  easy bruising or bleeding ENDO:  heat/cold intolerance  Past Obstetrical History: OB History    Gravida  4   Para  4   Term  4   Preterm      AB      Living    4     SAB      TAB      Ectopic      Multiple  0   Live Births  4           Past Gynecologic History: Patient's last menstrual period was 07/05/2018. Menstrual frequency Q 2 wks lasting 7-9 days requiring 15 pads/day,   Past Medical History: Past Medical History:  Diagnosis Date  . Anemia   . Cholelithiasis   . Depression   . Obesity     Past Surgical History:   Past Surgical History:  Procedure Laterality Date  . CHOLECYSTECTOMY N/A 11/03/2015   Procedure: LAPAROSCOPIC CHOLECYSTECTOMY;  Surgeon: Natale LayMark Bird, MD;  Location: [facility] ORS;  Service: General;  Laterality: N/A;  . TUBAL LIGATION Bilateral 08/22/2015   Procedure: POST PARTUM TUBAL LIGATION;  Surgeon: Suzy Bouchardhomas J Schermerhorn, MD;  Location: [facility] OR[facility];  Service: Gynecology;  Laterality: Bilateral;    Family History:  family history includes Hypotension in her mother; Otitis media in her father.  Social History:  Social History   Socioeconomic History  . Marital status: Single    Spouse name: Not on file  . Number of children: Not on file  . Years of education: Not on file  . Highest education level: Not on  file  Occupational History  . Not on file  Social Needs  . Financial resource strain: Not on file  . Food insecurity:    Worry: Not on file    Inability: Not on file  . Transportation needs:    Medical: Not on file    Non-medical: Not on file  Tobacco Use  . Smoking status: Never Smoker  . Smokeless tobacco: Never Used  Substance and Sexual Activity  . Alcohol use: No  . Drug use: No  . Sexual activity: Yes    Birth control/protection: Other-see comments  Lifestyle  . Physical activity:    Days per week: Not on file    Minutes per session: Not on file  . Stress: Not on file  Relationships  . Social connections:    Talks on phone: Not on file    Gets together: Not on file    Attends religious service: Not on file    Active member of club or organization: Not on file    Attends meetings  of clubs or organizations: Not on file    Relationship status: Not on file  . Intimate partner violence:    Fear of current or ex partner: Not on file    Emotionally abused: Not on file    Physically abused: Not on file    Forced sexual activity: Not on file  Other Topics Concern  . Not on file  Social History Narrative  . Not on file    Home Medications:  Medications reconciled in EPIC  No current facility-administered medications on file prior to encounter.    Current Outpatient Medications on File Prior to Encounter  Medication Sig Dispense Refill  . oxyCODONE-acetaminophen (ROXICET) 5-325 MG tablet Take 1 tablet by mouth every 6 (six) hours as needed for severe pain. 20 tablet 0    Allergies:  Allergies  Allergen Reactions  . Penicillins Hives    Physical Exam:  Temp:  [99 F (37.2 C)] 99 F (37.2 C) (07/25 1220) Pulse Rate:  [86-101] 86 (07/25 1619) Resp:  [16] 16 (07/25 1619) BP: (97-140)/(51-75) 111/75 (07/25 1639) SpO2:  [100 %] 100 % (07/25 1619) Weight:  [97.5 kg (215 lb)] 97.5 kg (215 lb) (07/25 1223)   General Appearance:  Well developed, well nourished, no acute distress, alert and oriented x3 HEENT:  Normocephalic atraumatic, extraocular movements intact, moist mucous membranes Cardiovascular:  Normal S1/S2, regular rate and rhythm, no murmurs Pulmonary:  clear to auscultation, no wheezes, rales or rhonchi, symmetric air entry, good air exchange Abdomen:  Bowel sounds present, soft, nontender, nondistended, no abnormal masses, no epigastric pain Extremities:  Full range of motion, no pedal edema, 2+ distal pulses, no tenderness Skin:  normal coloration and turgor, no rashes, no suspicious skin lesions noted  Neurologic:  Cranial nerves 2-12 grossly intact, normal muscle tone, strength 5/5 all four extremities Psychiatric:  Normal mood and affect, appropriate, no AH/VH Pelvic:  External wnl, no bleeding currently   Labs/Studies:   CBC and Coags:  Lab  Results  Component Value Date   WBC 8.0 07/12/2018   NEUTOPHILPCT 69 07/12/2018   EOSPCT 1 07/12/2018   BASOPCT 1 07/12/2018   LYMPHOPCT 26 07/12/2018   HGB 6.2 (L) 07/12/2018   HCT 20.8 (L) 07/12/2018   MCV 62.6 (L) 07/12/2018   PLT 282 07/12/2018   CMP:  Lab Results  Component Value Date   NA 138 12/07/2015   K 3.7 12/07/2015   CL 102 12/07/2015   CO2 29  12/07/2015   BUN 12 12/07/2015   CREATININE 0.63 12/07/2015   CREATININE 0.47 11/05/2015   CREATININE 0.46 11/04/2015   PROT 7.2 12/07/2015   BILITOT 0.4 12/07/2015   ALT 16 12/07/2015   AST 15 12/07/2015   ALKPHOS 60 12/07/2015    Other Imaging: No results found.   Assessment / Plan:   Tammy Herrera is a 37 y.o. [id number] who presents with sx anemia and menorrhagia  1. Anemia: acute on chronic blood loss anemia with sx - 3 u prbc transfusion now - consider iron infusions - strict I's and O's - vitals per protocol  2. Menorrhagia - IUD placement recommended. Will continue to try to find IUD to trial - Consider ablation vs hysterectomy  3. Uninsured - apply for Medicaid. If she is eligible, will give IUD in office promptly  4. ppx Encourage ambulation Regular diet

## 2018-07-12 NOTE — ED Notes (Addendum)
Pt to the hospital for vaginal bleeding. Pt was sent by md. Pt is in no distress at this time. Dr Lamont Snowballifenbark at the bedside to explain admission necessity.

## 2018-07-13 ENCOUNTER — Observation Stay: Payer: Medicaid Other

## 2018-07-13 ENCOUNTER — Encounter: Payer: Self-pay | Admitting: Internal Medicine

## 2018-07-13 LAB — URINALYSIS, COMPLETE (UACMP) WITH MICROSCOPIC
Bilirubin Urine: NEGATIVE
Glucose, UA: NEGATIVE mg/dL
Ketones, ur: NEGATIVE mg/dL
NITRITE: NEGATIVE
Protein, ur: NEGATIVE mg/dL
SPECIFIC GRAVITY, URINE: 1.01 (ref 1.005–1.030)
WBC, UA: >50 WBC/hpf — ABNORMAL HIGH (ref 0–5)
pH: 8 (ref 5.0–8.0)

## 2018-07-13 LAB — CBC
HCT: 29.9 % — ABNORMAL LOW (ref 35.0–47.0)
HEMOGLOBIN: 9.5 g/dL — AB (ref 12.0–16.0)
MCH: 21.8 pg — ABNORMAL LOW (ref 26.0–34.0)
MCHC: 31.8 g/dL — ABNORMAL LOW (ref 32.0–36.0)
MCV: 68.6 fL — ABNORMAL LOW (ref 80.0–100.0)
Platelets: 284 10*3/uL (ref 150–440)
RBC: 4.35 MIL/uL (ref 3.80–5.20)
RDW: 23.6 % — ABNORMAL HIGH (ref 11.5–14.5)
WBC: 8.4 10*3/uL (ref 3.6–11.0)

## 2018-07-13 LAB — TROPONIN I

## 2018-07-13 LAB — BRAIN NATRIURETIC PEPTIDE: B NATRIURETIC PEPTIDE 5: 45 pg/mL (ref 0.0–100.0)

## 2018-07-13 LAB — TRANSFUSION REACTION
DAT C3: NEGATIVE
POST RXN DAT IGG: NEGATIVE

## 2018-07-13 LAB — HEMOGLOBIN AND HEMATOCRIT, BLOOD
HCT: 27.8 % — ABNORMAL LOW (ref 35.0–47.0)
Hemoglobin: 8.9 g/dL — ABNORMAL LOW (ref 12.0–16.0)

## 2018-07-13 LAB — PREPARE RBC (CROSSMATCH)

## 2018-07-13 MED ORDER — CIPROFLOXACIN HCL 500 MG PO TABS
500.0000 mg | ORAL_TABLET | Freq: Two times a day (BID) | ORAL | Status: DC
  Administered 2018-07-14: 500 mg via ORAL
  Filled 2018-07-13 (×3): qty 1

## 2018-07-13 MED ORDER — ACETAMINOPHEN 325 MG PO TABS
650.0000 mg | ORAL_TABLET | Freq: Once | ORAL | Status: AC
  Administered 2018-07-13: 650 mg via ORAL
  Filled 2018-07-13: qty 2

## 2018-07-13 MED ORDER — DIPHENHYDRAMINE HCL 25 MG PO CAPS
25.0000 mg | ORAL_CAPSULE | Freq: Once | ORAL | Status: AC
  Administered 2018-07-13: 25 mg via ORAL
  Filled 2018-07-13: qty 1

## 2018-07-13 MED ORDER — SODIUM CHLORIDE 0.9% IV SOLUTION
Freq: Once | INTRAVENOUS | Status: AC
  Administered 2018-07-13: 10:00:00 via INTRAVENOUS

## 2018-07-13 MED ORDER — SODIUM CHLORIDE 0.9 % IV SOLN
400.0000 mg | Freq: Once | INTRAVENOUS | Status: AC
  Administered 2018-07-14: 400 mg via INTRAVENOUS
  Filled 2018-07-13: qty 20

## 2018-07-13 NOTE — Progress Notes (Signed)
Updated Dr. Dalbert GarnetBeasley on patient status including vital signs, increased respiratory rate, CXR results, and lab results.  No further orders at this time. Reynold BowenSusan Paisley Juliani Laduke, RN 07/13/2018 4:41 PM

## 2018-07-13 NOTE — Consult Note (Signed)
[facility] [place] [facility]   PATIENT NAME: Tammy Herrera    MR#:  [id number]  DATE OF BIRTH:  1981/08/17  DATE OF ADMISSION:  07/12/2018  PRIMARY CARE PHYSICIAN: [facility]   REQUESTING/REFERRING PHYSICIAN: Dr Dalbert GarnetBeasley  CHIEF COMPLAINT:   Chief Complaint  Patient presents with  . Menorrhagia    HISTORY OF PRESENT ILLNESS:  Tammy Herrera  is a 37 y.o. female with a known history of anemia with vaginal bleeding.  She received 3 units of packed red blood cells and was receiving her fourth unit of packed red blood cells today when she had developed some shortness of breath some pain in the chest and dizziness lasting about 20 minutes.  She feels good now.  She also had some abdominal pain in the left lower quadrant.  She received some pain medication but feels better.  Some history of constipation.  PAST MEDICAL HISTORY:   Past Medical History:  Diagnosis Date  . Anemia   . Cholelithiasis   . Depression   . Obesity     PAST SURGICAL HISTOIRY:   Past Surgical History:  Procedure Laterality Date  . CHOLECYSTECTOMY N/A 11/03/2015   Procedure: LAPAROSCOPIC CHOLECYSTECTOMY;  Surgeon: Natale LayMark Bird, MD;  Location: [facility] OR[facility];  Service: General;  Laterality: N/A;  . TUBAL LIGATION Bilateral 08/22/2015   Procedure: POST PARTUM TUBAL LIGATION;  Surgeon: Suzy Bouchardhomas J Schermerhorn, MD;  Location: [facility] OR[facility];  Service: Gynecology;  Laterality: Bilateral;    SOCIAL HISTORY:   Social History   Tobacco Use  . Smoking status: Never Smoker  . Smokeless tobacco: Never Used  Substance Use Topics  . Alcohol use: No    FAMILY HISTORY:   Family History  Problem Relation Age of Onset  . Hypotension Mother   . CAD Mother   . Otitis media Father     DRUG ALLERGIES:   Allergies  Allergen Reactions  . Penicillins Hives    REVIEW OF SYSTEMS:  CONSTITUTIONAL: No fever, chills or sweats.  Positive for fatigue.  Positive for weight loss. EYES: No  blurred or double vision.  EARS, NOSE, AND THROAT: No tinnitus or ear pain.  RESPIRATORY: No cough.  Some shortness of breath.  No wheezing or hemoptysis.  CARDIOVASCULAR: Positive for chest pain.  No orthopnea, edema.  GASTROINTESTINAL: No nausea, vomiting, diarrhea.  Positive for abdominal pain left lower quadrant.  Positive for constipation. GENITOURINARY: No dysuria, hematuria.  ENDOCRINE: No polyuria, nocturia,  HEMATOLOGY: History of anemia, no easy bruising or bleeding SKIN: No rash or lesion. MUSCULOSKELETAL: No joint pain or arthritis.   NEUROLOGIC: No tingling, numbness, weakness.  PSYCHIATRY: No anxiety or depression.   MEDICATIONS AT HOME:   Prior to Admission medications   Medication Sig Start Date End Date Taking? Authorizing Provider  oxyCODONE-acetaminophen (ROXICET) 5-325 MG tablet Take 1 tablet by mouth every 6 (six) hours as needed for severe pain. Patient not taking: Reported on 07/12/2018 12/07/15   Ricarda FrameWoodham, Charles, MD      VITAL SIGNS:  Blood pressure (!) 117/56, pulse 82, temperature 98.6 F (37 C), temperature source Oral, resp. rate 20, height 5\' 5"  (1.651 m), weight 97.5 kg (215 lb), last menstrual period 07/05/2018, SpO2 100 %, unknown if currently breastfeeding.  PHYSICAL EXAMINATION:  GENERAL:  37 y.o.-year-old patient lying in the bed with no acute distress.  EYES: Pupils equal, round, reactive to light and accommodation. No scleral icterus. Extraocular muscles intact.  HEENT: Head atraumatic, normocephalic. Oropharynx and nasopharynx clear.  NECK:  Supple, no jugular venous distention. No thyroid enlargement, no tenderness.  LUNGS: Normal breath sounds bilaterally, no wheezing, rales,rhonchi or crepitation. No use of accessory muscles of respiration.  CARDIOVASCULAR: S1, S2 normal. No murmurs, rubs, or gallops.  ABDOMEN: Soft,  slight left lower quadrant tenderness, nondistended. Bowel sounds present. No organomegaly or mass.  EXTREMITIES: No pedal  edema, cyanosis, or clubbing.  NEUROLOGIC: Cranial nerves II through XII are intact. Muscle strength 5/5 in all extremities. Sensation intact. Gait not checked.  PSYCHIATRIC: The patient is alert and oriented x 3.  SKIN: No obvious rash, lesion, or ulcer.   LABORATORY PANEL:   CBC Recent Labs  Lab 07/13/18 1147  WBC 8.4  HGB 9.5*  HCT 29.9*  PLT 284   ------------------------------------------------------------------------------------------------------------------  ------------------------------------------------------------------------------------------------------------------  Cardiac Enzymes Recent Labs  Lab 07/13/18 1757  TROPONINI <0.03   ------------------------------------------------------------------------------------------------------------------  RADIOLOGY:  Dg Chest 1 View  Result Date: 07/13/2018 CLINICAL DATA:  Chest pain and shortness of breath EXAM: CHEST  1 VIEW COMPARISON:  None. FINDINGS: Lungs are clear. Heart is upper normal in size with pulmonary vascularity normal. No adenopathy. No pneumothorax. No bone lesions. IMPRESSION: No edema or consolidation.  Heart upper normal in size. Electronically Signed   By: Bretta Bang III M.D.   On: 07/13/2018 14:14    EKG:   Normal sinus rhythm 96 bpm no acute ST-T wave changes  IMPRESSION AND PLAN:   1.  Chest pain and shortness of breath with fourth unit of blood.  Respiratory status is stable, cardiac enzymes are negative.  Continue to monitor.  Would hold off on further on this hospital stay. 2.  Abdominal pain.  This seems a little bit better now.  Continue to monitor.  If further abdominal pain tomorrow can consider a CT scan kidney stone protocol of the abdomen for further evaluation.  Urinalysis does look positive so I will put on some p.o. Cipro.  And send off a urine culture. 3.  Iron deficiency anemia with vaginal bleeding.  Last hemoglobin 9.5.  Give IV iron tomorrow morning.  Case discussed with  gynecology and they will put an IUD in. 4.  Relative hypotension on presentation.  Blood pressure has improved after transfusions.  All the records are reviewed and case discussed with Consulting provider. Management plans discussed with the patient, family and they are in agreement.  CODE STATUS: Full code  TOTAL TIME TAKING CARE OF THIS PATIENT: 45 minutes.    Alford Highland M.D on 07/13/2018 at 9:45 PM  Between 7am to 6pm - Pager - [phone]  After 6pm go to [email or web address] - Social research officer, government  [facility]  Office  [phone]  CC: Primary care Physician: Raynelle Bring

## 2018-07-13 NOTE — Progress Notes (Signed)
Pt seen and assessed after reaction to 4th unit of blood this morning, resulting in rapid response with quick recovery. Since event, chest xray was normal without evidence of edema, injury or cardiomegaly, though heart is sized upper limits of normal. Serial troponins so far negative, B nat peptide normal, post rxn DAT IgG negative. She is on tele with normal heart rhythm.  She noticed a LLQ pain about an hour ago, not limiting, which is mild. It worsens with palpation, but does not limit movement. Does not appear to be a trigger point, and does follow the left rectus muscle. No CVA tenderness. Rovsing's negative.   Her respiratory rate is elevated into the low 20s and her DBP continues to be low, but without tachycardia. Her O2 sats at 100% on room air.  Her urine output is normal, approx 12700ml/hr.  She denies chest pain, SOB, palpitations, dizziness. She is ambulating without difficulty or SOB around nurses station. Tolerating regular po  Objective: Vital signs in last 24 hours: Temp:  [98.2 F (36.8 C)-99.3 F (37.4 C)] 98.6 F (37 C) (07/26 2024) Pulse Rate:  [71-156] 82 (07/26 2024) Resp:  [16-32] 20 (07/26 2024) BP: (90-126)/(32-87) 117/56 (07/26 2024) SpO2:  [100 %] 100 % (07/26 2024)  Intake/Output  Intake/Output Summary (Last 24 hours) at 07/13/2018 2047 Last data filed at 07/13/2018 1753 Gross per 24 hour  Intake 2289.75 ml  Output 3350 ml  Net -1060.25 ml    Physical Exam:  General: Alert and oriented. CV: RRR in 6 lung fields Lungs: Clear bilaterally. GI: Soft, Nondistended. TTP over LLQ, mild, and appears to be suprafascial but Carnett's is negative. No CVA tenderness Urine: Clear Extremities: Nontender, no erythema, trace edema.  Assessment/Plan: Currently doing well. Will consult hospitalist service for review of vital signs, consideration of cardiovascular abnormalities, pulm status.  Christeen DouglasBethany Koichi Platte, MD   LOS: 0 days   Christeen DouglasBethany Denasia Venn 07/13/2018, 8:47  PM

## 2018-07-13 NOTE — Progress Notes (Signed)
   07/13/18 1120  Clinical Encounter Type  Visited With Patient and family together  Visit Type Initial;Spiritual support  Referral From Nurse  Consult/Referral To Chaplain  Spiritual Encounters  Spiritual Needs Prayer   CH responded to a rapid response page to Mother Baby floor.  Patient was not responding to verbal conversation and her husband and children were present in the room but would not leave the patient's side. CH prayed outside of the room and will follow up.

## 2018-07-13 NOTE — Progress Notes (Signed)
Discussed blood pressures and respiratory rates with Genia DelMargaret Haviland, CNM because  Dr. Dalbert GarnetBeasley in OR  CNM states that she will discuss with Dr. Dalbert GarnetBeasley, and MD will assess patient when she is out of OR. Reynold BowenSusan Paisley Krue Peterka, RN 07/13/2018 8:01 PM

## 2018-07-13 NOTE — Progress Notes (Signed)
Attempted throughout day to reach financial counselor to assist patient with Medicaid.  Left messages for Nicholos JohnsShannon Reid, Family and Children's Medicaid Services, Steward DroneBrenda with Care Management,  And Financial Advisor- Bjorn Loserhonda.

## 2018-07-13 NOTE — Progress Notes (Signed)
Phone call to hospitalist on call helpful, and consult placed.

## 2018-07-13 NOTE — Progress Notes (Signed)
In verbal handoff for shift change, report was received that patient complained of intermittent dizziness, and patient complained of mild headache before transfusion initiated  Pre- meds given prior to Blood Transfusion. Blood transfusion initiated at 120 ml/hr at 1054 per policy.  At 1106, patient complained of dizziness, but no other complaints or VS changes. At 1113, patient appeared very anxious and stated that she can't breathe.  Transfusion stopped and interpreter iPAD started.  Patient stated that she has chest pain, "can't breathe", and "can't feel her arms".  Patient then stopped answering questions and remained tachypneic with RR in 20s and pulse increased to 156 per [facility].  Rapid Response immediately called. Three RNs and NT remained with patient until RR team arrived.  Blood bag and tubing taken to Blood Bank. Policy followed at that point for possible transfusion reaction. MD and CNM notified. CNM at the bedside at 1138.  Orders received for EKG, Troponin, CBC, and transfusion reaction labs by Rapid Response Nurse.  Patient vitals returned to baseline within 15 minutes, and patient states that chest pain has decreased to 2/10 and breathing feels normal.  Patient placed on continuous off-site telemetry monitoring. Reynold BowenSusan Paisley Abbegale Stehle, RN 07/13/2018 1:40 PM

## 2018-07-14 LAB — TYPE AND SCREEN
ABO/RH(D): O NEG
Antibody Screen: NEGATIVE
UNIT DIVISION: 0
Unit division: 0
Unit division: 0
Unit division: 0

## 2018-07-14 LAB — BPAM RBC
BLOOD PRODUCT EXPIRATION DATE: 201907312359
BLOOD PRODUCT EXPIRATION DATE: 201907312359
Blood Product Expiration Date: 201908072359
Blood Product Expiration Date: 201908112359
ISSUE DATE / TIME: 201907251944
ISSUE DATE / TIME: 201907252146
ISSUE DATE / TIME: 201907261040
ISSUE DATE / TIME: 201907260010
UNIT TYPE AND RH: 9500
UNIT TYPE AND RH: 9500
UNIT TYPE AND RH: 9500
Unit Type and Rh: 9500

## 2018-07-14 LAB — BASIC METABOLIC PANEL
Anion gap: 4 — ABNORMAL LOW (ref 5–15)
BUN: 12 mg/dL (ref 6–20)
CALCIUM: 8.6 mg/dL — AB (ref 8.9–10.3)
CO2: 28 mmol/L (ref 22–32)
Chloride: 108 mmol/L (ref 98–111)
Creatinine, Ser: 0.7 mg/dL (ref 0.44–1.00)
GFR calc Af Amer: >60 mL/min (ref >60)
GLUCOSE: 103 mg/dL — AB (ref 70–99)
POTASSIUM: 3.7 mmol/L (ref 3.5–5.1)
Sodium: 140 mmol/L (ref 135–145)

## 2018-07-14 LAB — TROPONIN I: Troponin I: <0.03 ng/mL (ref <0.03)

## 2018-07-14 LAB — HEMOGLOBIN: HEMOGLOBIN: 8.9 g/dL — AB (ref 12.0–16.0)

## 2018-07-14 MED ORDER — FERROUS SULFATE 325 (65 FE) MG PO TABS: 325.0000 mg | ORAL_TABLET | Freq: Two times a day (BID) | ORAL | 1 refills | Status: AC

## 2018-07-14 NOTE — Discharge Instructions (Signed)
Call your doctor for increased pain or vaginal bleeding, temperature above 100.4, depression, or concerns.  Call your doctor for chest pain, shortness of breath, dizziness, headache, vision changes, or any new symptoms.  No strenuous activity or heavy lifting until cleared by your doctor.  No intercourse, tampons, or douching until cleared by your doctor.  No driving until cleared by your doctor.

## 2018-07-14 NOTE — Progress Notes (Signed)
[employer] - [place] at [facility]                                                                                                                                                                                  Patient Demographics   Tammy Herrera, is a 37 y.o. female, DOB - Sep 14, 1981, ZOX:[id number]  Admit date - 07/12/2018   Admitting Physician Christeen Douglas, MD  Outpatient Primary MD for the patient is [facility]   LOS - 0  Subjective: Patient having no symptoms currently chest pain-free    Review of Systems:   CONSTITUTIONAL: No documented fever. No fatigue, weakness. No weight gain, no weight loss.  EYES: No blurry or double vision.  ENT: No tinnitus. No postnasal drip. No redness of the oropharynx.  RESPIRATORY: No cough, no wheeze, no hemoptysis. No dyspnea.  CARDIOVASCULAR: No chest pain. No orthopnea. No palpitations. No syncope.  GASTROINTESTINAL: No nausea, no vomiting or diarrhea. No abdominal pain. No melena or hematochezia.  GENITOURINARY: No dysuria or hematuria.  ENDOCRINE: No polyuria or nocturia. No heat or cold intolerance.  HEMATOLOGY: No anemia. No bruising. No bleeding.  INTEGUMENTARY: No rashes. No lesions.  MUSCULOSKELETAL: No arthritis. No swelling. No gout.  NEUROLOGIC: No numbness, tingling, or ataxia. No seizure-type activity.  PSYCHIATRIC: No anxiety. No insomnia. No ADD.    Vitals:   Vitals:   07/14/18 0402 07/14/18 0750 07/14/18 0813 07/14/18 1157  BP: (!) 93/55  (!) 115/54 (!) 119/50  Pulse: 75  74 64  Resp: (!) 22 20 16 20   Temp: 98.1 F (36.7 C)  99.1 F (37.3 C) 98.5 F (36.9 C)  TempSrc: Oral  Oral Oral  SpO2: 100%  100% 100%  Weight:      Height:        Wt Readings from Last 3 Encounters:  07/12/18 97.5 kg (215 lb)  12/24/15 100.2 kg (221 lb)  12/07/15 100.2 kg (221 lb)     Intake/Output Summary (Last 24 hours) at 07/14/2018 1451 Last data filed at 07/14/2018 1110 Gross per 24 hour   Intake 510 ml  Output 2300 ml  Net -1790 ml    Physical Exam:   GENERAL: Pleasant-appearing in no apparent distress.  HEAD, EYES, EARS, NOSE AND THROAT: Atraumatic, normocephalic. Extraocular muscles are intact. Pupils equal and reactive to light. Sclerae anicteric. No conjunctival injection. No oro-pharyngeal erythema.  NECK: Supple. There is no jugular venous distention. No bruits, no lymphadenopathy, no thyromegaly.  HEART: Regular rate and rhythm,. No murmurs, no rubs, no clicks.  LUNGS: Clear to auscultation bilaterally. No rales or rhonchi. No wheezes.  ABDOMEN: Soft, flat, nontender, nondistended. Has good bowel sounds. No  hepatosplenomegaly appreciated.  EXTREMITIES: No evidence of any cyanosis, clubbing, or peripheral edema.  +2 pedal and radial pulses bilaterally.  NEUROLOGIC: The patient is alert, awake, and oriented x3 with no focal motor or sensory deficits appreciated bilaterally.  SKIN: Moist and warm with no rashes appreciated.  Psych: Not anxious, depressed LN: No inguinal LN enlargement    Antibiotics   Anti-infectives (From admission, onward)   Start     Dose/Rate Route Frequency Ordered Stop   07/13/18 2145  ciprofloxacin (CIPRO) tablet 500 mg     500 mg Oral 2 times daily 07/13/18 2144 07/17/18 0559      Medications   Scheduled Meds: . ciprofloxacin  500 mg Oral BID  . docusate sodium  100 mg Oral BID   Continuous Infusions: PRN Meds:.alum & mag hydroxide-simeth, bisacodyl, HYDROmorphone (DILAUDID) injection, ibuprofen, magnesium citrate, magnesium hydroxide, ondansetron **OR** ondansetron (ZOFRAN) IV, oxyCODONE-acetaminophen, zolpidem   Data Review:   Micro Results No results found for this or any previous visit (from the past 240 hour(s)).  Radiology Reports Dg Chest 1 View  Result Date: 07/13/2018 CLINICAL DATA:  Chest pain and shortness of breath EXAM: CHEST  1 VIEW COMPARISON:  None. FINDINGS: Lungs are clear. Heart is upper normal in size  with pulmonary vascularity normal. No adenopathy. No pneumothorax. No bone lesions. IMPRESSION: No edema or consolidation.  Heart upper normal in size. Electronically Signed   By: Bretta Bang III M.D.   On: 07/13/2018 14:14     CBC Recent Labs  Lab 07/12/18 1237 07/13/18 0542 07/13/18 1147 07/14/18 0540  WBC 8.0  --  8.4  --   HGB 6.2* 8.9* 9.5* 8.9*  HCT 20.8* 27.8* 29.9*  --   PLT 282  --  284  --   MCV 62.6*  --  68.6*  --   MCH 18.6*  --  21.8*  --   MCHC 29.8*  --  31.8*  --   RDW 19.8*  --  23.6*  --   LYMPHSABS 2.0  --   --   --   MONOABS 0.4  --   --   --   EOSABS 0.0  --   --   --   BASOSABS 0.0  --   --   --     Chemistries  Recent Labs  Lab 07/14/18 0540  NA 140  K 3.7  CL 108  CO2 28  GLUCOSE 103*  BUN 12  CREATININE 0.70  CALCIUM 8.6*   ------------------------------------------------------------------------------------------------------------------ estimated creatinine clearance is 111.3 mL/min (by C-G formula based on SCr of 0.7 mg/dL). ------------------------------------------------------------------------------------------------------------------ No results for input(s): HGBA1C in the last 72 hours. ------------------------------------------------------------------------------------------------------------------ No results for input(s): CHOL, HDL, LDLCALC, TRIG, CHOLHDL, LDLDIRECT in the last 72 hours. ------------------------------------------------------------------------------------------------------------------ No results for input(s): TSH, T4TOTAL, T3FREE, THYROIDAB in the last 72 hours.  Invalid input(s): FREET3 ------------------------------------------------------------------------------------------------------------------ No results for input(s): VITAMINB12, FOLATE, FERRITIN, TIBC, IRON, RETICCTPCT in the last 72 hours.  Coagulation profile No results for input(s): INR, PROTIME in the last 168 hours.  No results for input(s):  DDIMER in the last 72 hours.  Cardiac Enzymes Recent Labs  Lab 07/13/18 1134 07/13/18 1757 07/13/18 2349  TROPONINI <0.03 <0.03 <0.03   ------------------------------------------------------------------------------------------------------------------ Invalid input(s): POCBNP    Assessment & Plan   1.  Chest pain and shortness of breath possibly related to transfusion no further symptoms 2.  Abdominal pain.    Resolved  3.  Iron deficiency anemia with vaginal bleeding.  Last hemoglobin 9.5.  Give IV iron tomorrow morning.  Case discussed with gynecology and they will put an IUD in. 4.  Relative hypotension on presentation.    Blood pressure now normal       Code Status Orders  (From admission, onward)        Start     Ordered   07/12/18 1529  Full code  Continuous     07/12/18 1530    Code Status History    Date Active Date Inactive Code Status Order ID Comments User Context   11/02/2015 1446 11/07/2015 1808 Full Code [id number]  Natale LayBird, Mark, MD Inpatient   08/22/2015 0057 08/23/2015 2217 Full Code [id number]  Schermerhorn, Ihor Austinhomas J, MD Inpatient   08/21/2015 0751 08/22/2015 0057 Full Code [id number]  Karena AddisonSigmon, Meredith C, CNM Inpatient   07/08/2015 2152 07/09/2015 0205 Full Code [id number]  Berneta LevinsWagoner, Joy L, RN Inpatient        Okay to discharge home       Lab Results  Component Value Date   PLT 284 07/13/2018     Time Spent in minutes 35 minutes greater than 50% of time spent in care coordination and counseling patient regarding the condition and plan of care.   Auburn BilberryShreyang Ronne Stefanski M.D on 07/14/2018 at 2:51 PM  Between 7am to 6pm - Pager - [phone]  After 6pm go to [email or web address] - Social research officer, governmentpassword [email or web address]  [employer]   Office  [phone]

## 2018-07-14 NOTE — Discharge Summary (Signed)
Subjective: The patient is doing well this morning. BP still mildly low, but asx. Ambulating without sx or assistance, voiding spontaneously, no SOB, chest pain. Mildly TTP in LLQ  Objective: Vital signs in last 24 hours: Temp:  [98.1 F (36.7 C)-99.3 F (37.4 C)] 98.1 F (36.7 C) (07/27 0402) Pulse Rate:  [73-156] 75 (07/27 0402) Resp:  [20-32] 22 (07/27 0402) BP: (84-126)/(34-87) 93/55 (07/27 0402) SpO2:  [100 %] 100 % (07/27 0402)  Intake/Output  Intake/Output Summary (Last 24 hours) at 07/14/2018 0759 Last data filed at 07/14/2018 0549 Gross per 24 hour  Intake 1284.08 ml  Output 4000 ml  Net -2715.92 ml    Physical Exam:  General: Alert and oriented. CV: RRR Lungs: Clear bilaterally. GI: Soft, Nondistended. Extremities: Nontender, no erythema, no edema.  Lab Results: Recent Labs    07/12/18 1237 07/13/18 0542 07/13/18 1147 07/14/18 0540  HGB 6.2* 8.9* 9.5* 8.9*  HCT 20.8* 27.8* 29.9*  --   WBC 8.0  --  8.4  --   PLT 282  --  284  --                  Results for orders placed or performed during the hospital encounter of 07/12/18 (from the past 24 hour(s))  Troponin I (q 6hr x 3)     Status: None   Collection Time: 07/13/18 11:34 AM  Result Value Ref Range   Troponin I <0.03 <0.03 ng/mL  CBC     Status: Abnormal   Collection Time: 07/13/18 11:47 AM  Result Value Ref Range   WBC 8.4 3.6 - 11.0 K/uL   RBC 4.35 3.80 - 5.20 MIL/uL   Hemoglobin 9.5 (L) 12.0 - 16.0 g/dL   HCT 16.129.9 (L) 09.635.0 - 04.547.0 %   MCV 68.6 (L) 80.0 - 100.0 fL   MCH 21.8 (L) 26.0 - 34.0 pg   MCHC 31.8 (L) 32.0 - 36.0 g/dL   RDW 40.923.6 (H) 81.111.5 - 91.414.5 %   Platelets 284 150 - 440 K/uL  Brain natriuretic peptide     Status: None   Collection Time: 07/13/18 11:47 AM  Result Value Ref Range   B Natriuretic Peptide 45.0 0.0 - 100.0 pg/mL  Transfusion reaction     Status: None   Collection Time: 07/13/18 11:52 AM  Result Value Ref Range   Post RXN DAT IgG NEG    DAT C3 NEG    Path interp tx  rxn      PATIENT EXPERIENCED SHORTNESS OF BREATH,CHEST PAIN AND TACHYCARDIA. RULE OUT TRALI PER DR.PATRICK. CHEST XRAYS ORDERED AND TRALI LIKELY RULES OUT. DR. Luisa HartPATRICK SPOKE WITH DR. Dalbert GarnetBEASLEY. SUBSEQUENT TRANSFUSIONS MAY OCCUR IF NEEDED PER DR.PATRICK. 07/13/18  1430 SJL Performed at [facility], [address] [place], [place] [address]   Urinalysis, Complete w Microscopic     Status: Abnormal   Collection Time: 07/13/18  1:42 PM  Result Value Ref Range   Color, Urine YELLOW (A) YELLOW   APPearance HAZY (A) CLEAR   Specific Gravity, Urine 1.010 1.005 - 1.030   pH 8.0 5.0 - 8.0   Glucose, UA NEGATIVE NEGATIVE mg/dL   Hgb urine dipstick SMALL (A) NEGATIVE   Bilirubin Urine NEGATIVE NEGATIVE   Ketones, ur NEGATIVE NEGATIVE mg/dL   Protein, ur NEGATIVE NEGATIVE mg/dL   Nitrite NEGATIVE NEGATIVE   Leukocytes, UA LARGE (A) NEGATIVE   RBC / HPF 0-5 0 - 5 RBC/hpf   WBC, UA >50 (H) 0 - 5 WBC/hpf  Bacteria, UA MANY (A) NONE SEEN   Squamous Epithelial / LPF 21-50 0 - 5   Mucus PRESENT   Troponin I (q 6hr x 3)     Status: None   Collection Time: 07/13/18  5:57 PM  Result Value Ref Range   Troponin I <0.03 <0.03 ng/mL  Troponin I (q 6hr x 3)     Status: None   Collection Time: 07/13/18 11:49 PM  Result Value Ref Range   Troponin I <0.03 <0.03 ng/mL  Hemoglobin     Status: Abnormal   Collection Time: 07/14/18  5:40 AM  Result Value Ref Range   Hemoglobin 8.9 (L) 12.0 - 16.0 g/dL  Basic metabolic panel     Status: Abnormal   Collection Time: 07/14/18  5:40 AM  Result Value Ref Range   Sodium 140 135 - 145 mmol/L   Potassium 3.7 3.5 - 5.1 mmol/L   Chloride 108 98 - 111 mmol/L   CO2 28 22 - 32 mmol/L   Glucose, Bld 103 (H) 70 - 99 mg/dL   BUN 12 6 - 20 mg/dL   Creatinine, Ser 4.09 0.44 - 1.00 mg/dL   Calcium 8.6 (L) 8.9 - 10.3 mg/dL   GFR calc non Af Amer >60 >60 mL/min   GFR calc Af Amer >60 >60 mL/min   Anion gap 4 (L) 5 - 15    Assessment/Plan: Chronic blood loss  anemia Mild transfusion reaction - chest xray normal and troponins neg Hypotension with tachypnea  Home with po iron. Hospitalist involvement overnight appreciated. They were going to recheck today and consider iv iron prior to discharge., which I agree with. She is asx with O2 sats of 100% on room air. I don't have a reason for the hypotension, but she appears well. I will have her f/u in the clinic this week to repeat vitals. Long term plan for IUD placement for management of menorrhagia. While she was in house, we attempted to get her applied for medicaid, for which she is eligible. I will see if this is something we can pursue as an outpatient.  Christeen Douglas, MD   LOS: 0 days   Christeen Douglas 07/14/2018, 7:59 AM

## 2018-07-14 NOTE — Progress Notes (Signed)
Dr. Allena KatzPatel contacted to clarify plan of care.  Dr. Allena KatzPatel states that patient may be discharged to home. Reynold BowenSusan Paisley Quavon Keisling, RN 07/14/2018 2:51 PM

## 2018-07-14 NOTE — Progress Notes (Signed)
Discharge instructions provided.  Pt and sig other verbalize understanding of all instructions and follow-up care via interpreter.  Prescription given.  Pt discharged to home via wheelchair by RN. Reynold BowenSusan Paisley Daisi Kentner, RN 07/14/2018 6:01 PM

## 2018-07-15 LAB — URINE CULTURE

## 2018-10-16 ENCOUNTER — Other Ambulatory Visit: Payer: Self-pay | Admitting: Internal Medicine

## 2018-10-16 DIAGNOSIS — N644 Mastodynia: Secondary | ICD-10-CM

## 2018-11-06 ENCOUNTER — Ambulatory Visit
Admission: RE | Admit: 2018-11-06 | Discharge: 2018-11-06 | Disposition: A | Discharge status: Home / Self Care | Payer: Self-pay | Source: Ambulatory Visit | Attending: Internal Medicine | Admitting: Internal Medicine

## 2018-11-06 ENCOUNTER — Encounter: Payer: Self-pay | Admitting: Radiology

## 2018-11-06 DIAGNOSIS — N644 Mastodynia: Secondary | ICD-10-CM

## 2019-11-06 ENCOUNTER — Other Ambulatory Visit: Payer: Self-pay

## 2019-11-06 DIAGNOSIS — Z20822 Contact with and (suspected) exposure to covid-19: Secondary | ICD-10-CM

## 2019-11-07 LAB — NOVEL CORONAVIRUS, NAA: SARS-CoV-2, NAA: NOT DETECTED

## 2020-05-03 IMAGING — MG DIGITAL DIAGNOSTIC BILATERAL MAMMOGRAM WITH TOMO AND CAD
2 series · 3 of 6 positions shown · non-contrast
Comparison: Baseline mammogram.

CLINICAL DATA: Focal left subareolar breast pain for 3 months.

EXAM:
DIGITAL DIAGNOSTIC BILATERAL MAMMOGRAM WITH CAD AND TOMO
ULTRASOUND LEFT BREAST

[L MLO synth-2D]
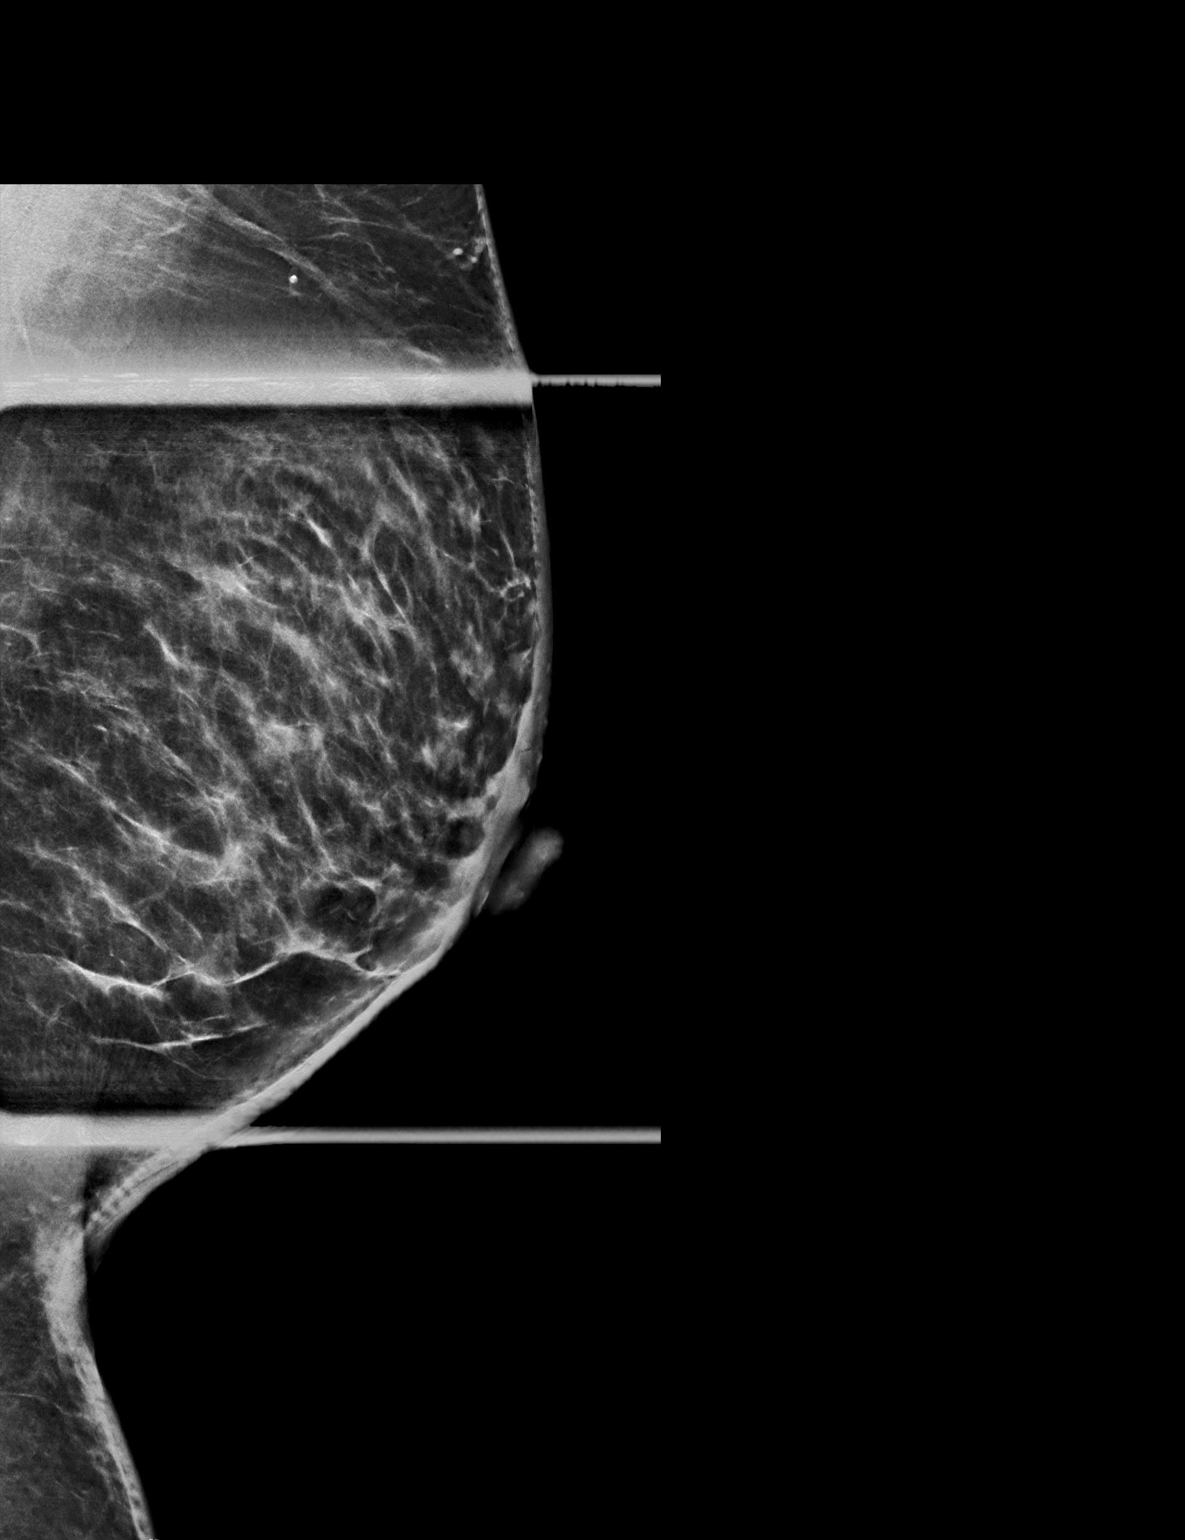

[R CC tomo · 2 of 79 frames shown]
[frame 26/79]
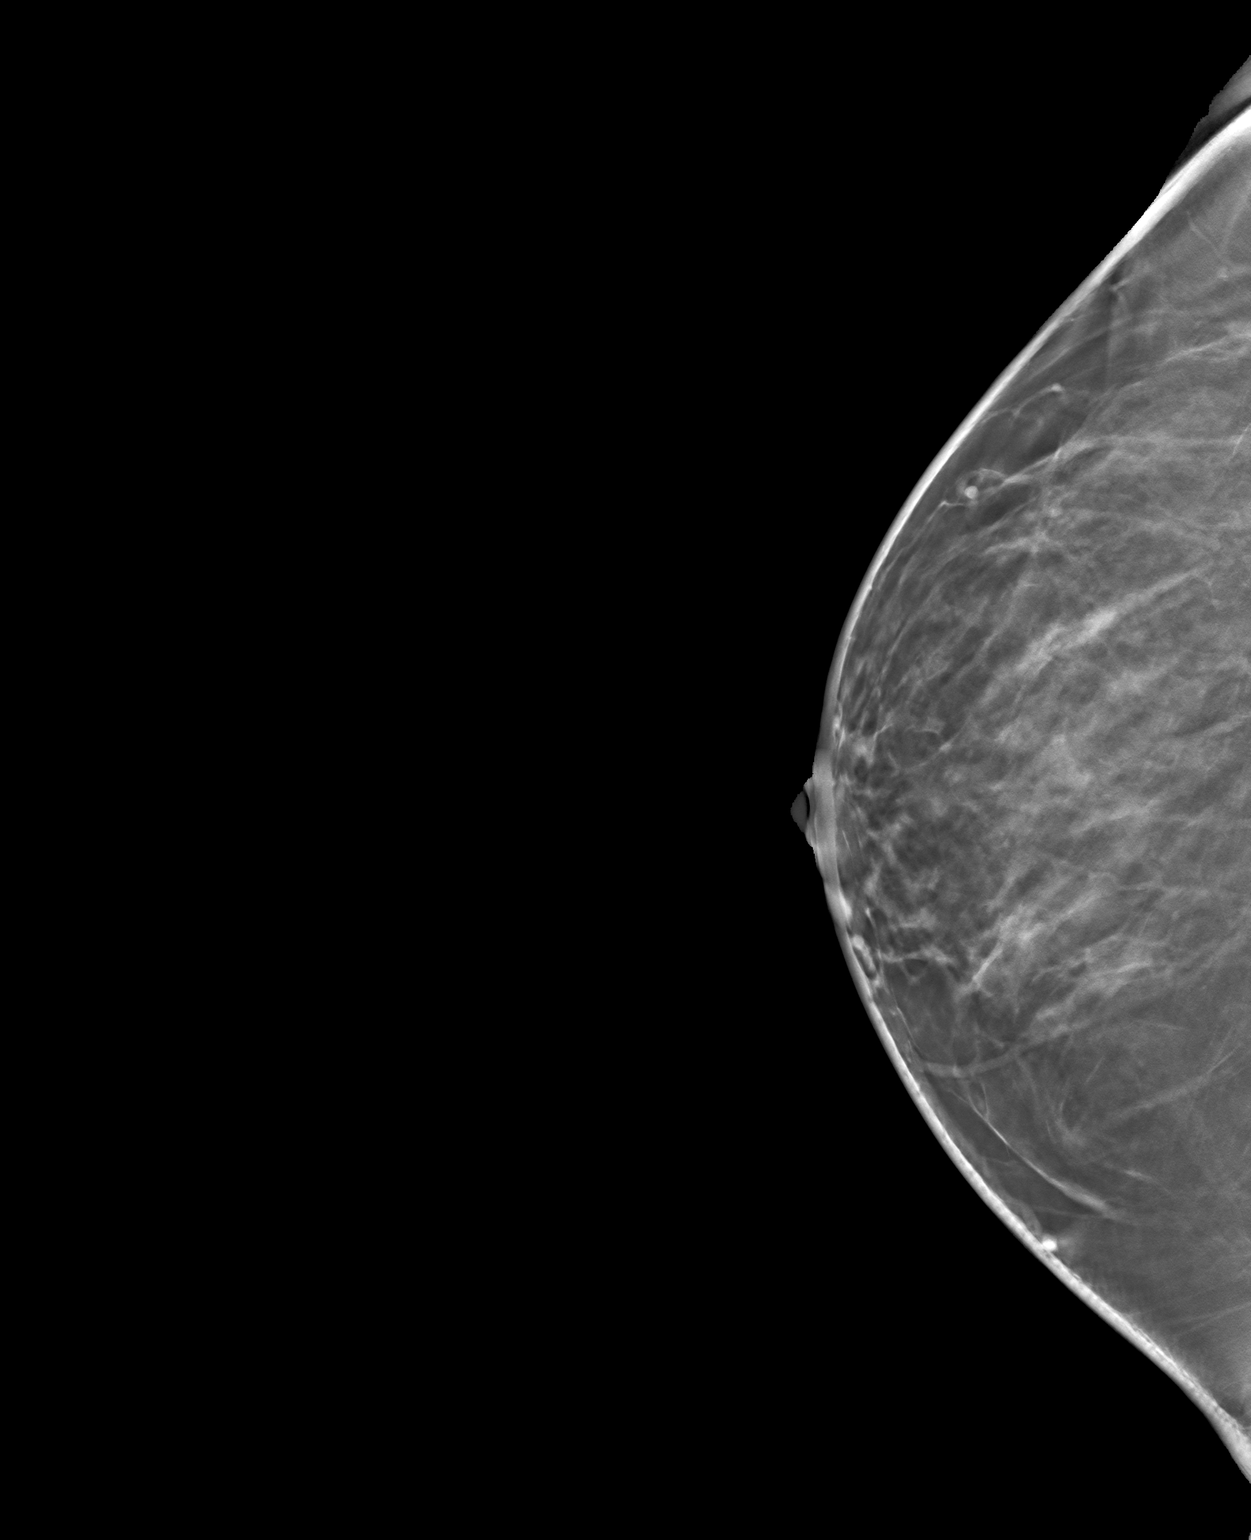
[frame 40/79]
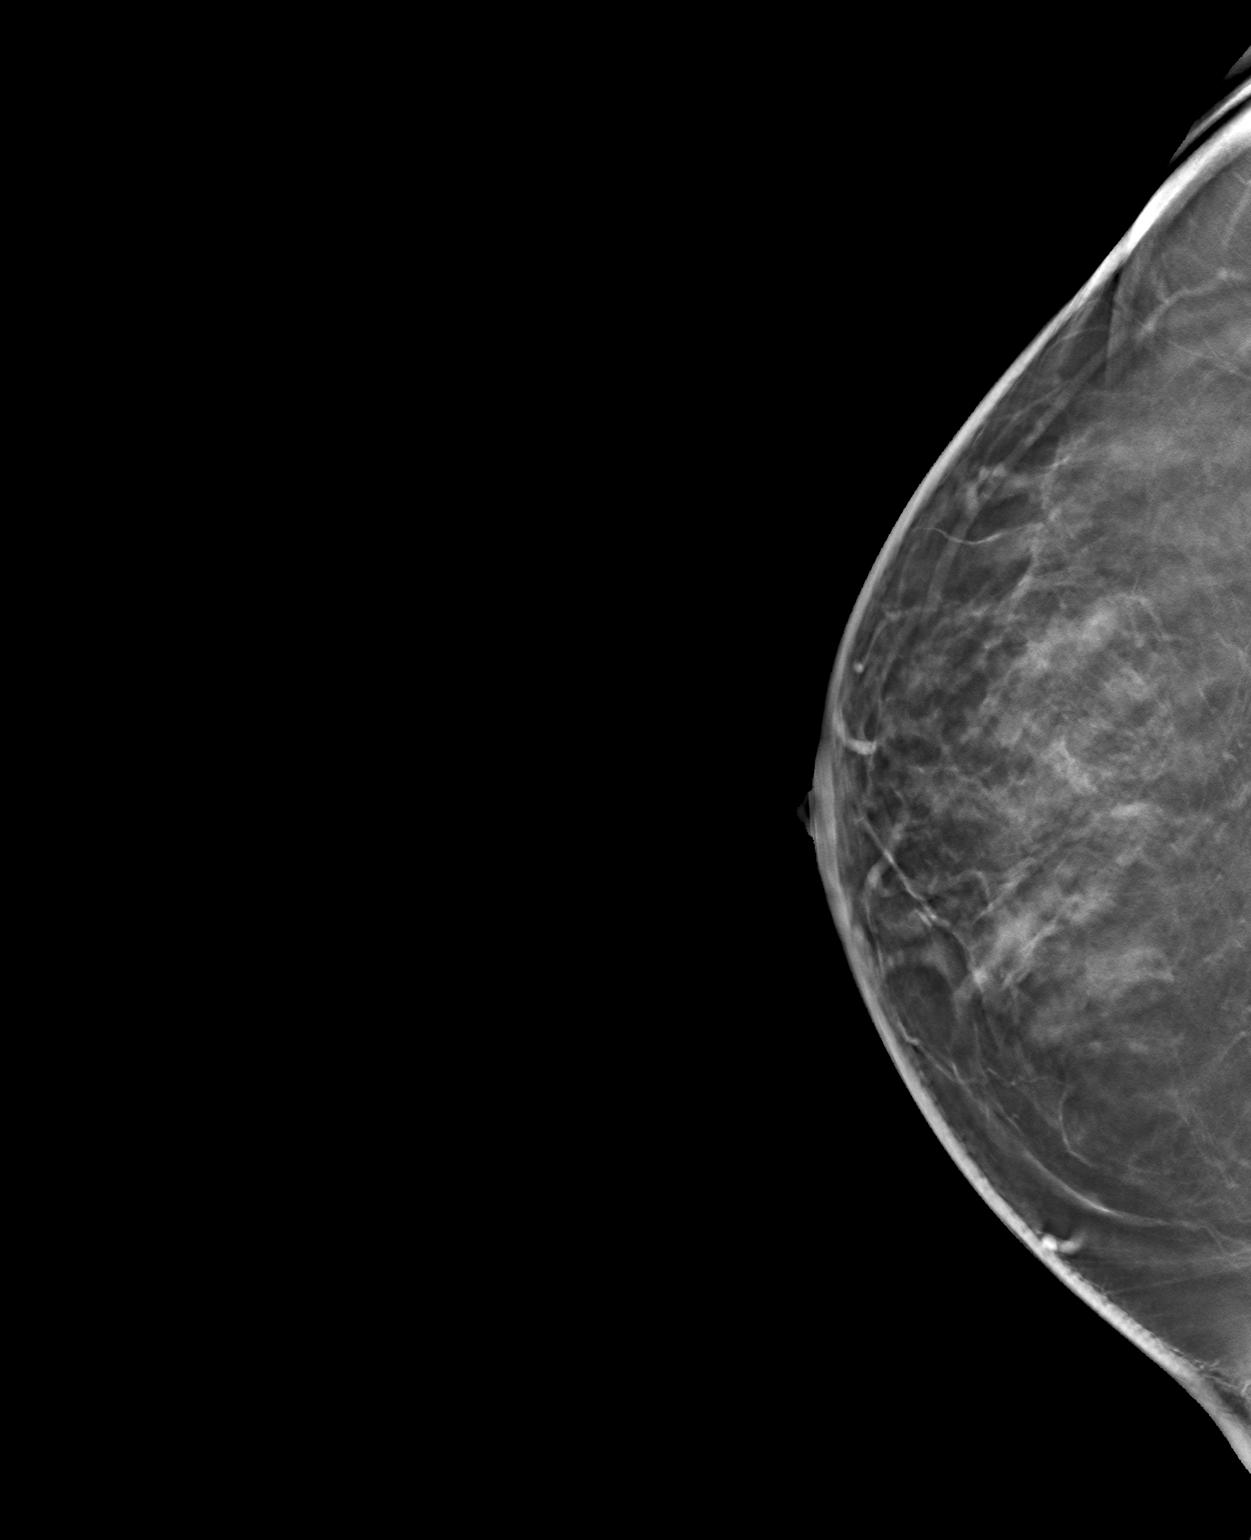

[3 of 6 positions shown; findings below may reference images not displayed]

ACR Breast Density Category c: The breast tissue is heterogeneously
dense, which may obscure small masses.
FINDINGS: Mammographically, there are no suspicious masses, areas of
architectural distortion or microcalcifications in either breast.

Mammographic images were processed with CAD.

On physical exam, no suspicious masses are palpated.

Targeted ultrasound is performed, showing no suspicious masses or
shadowing lesions in the central left breast, to correspond to the
area of focal pain.
IMPRESSION: No mammographic or sonographic evidence of malignancy in either
breast.

RECOMMENDATION:
Further management of patient's area of palpable concern should be
based on clinical grounds.

Otherwise, screening mammogram at age 39 unless there are persistent
or intervening clinical concerns. (Code:WM-V-89E)

I have discussed the findings and recommendations with the patient.
Results were also provided in writing at the conclusion of the
visit. If applicable, a reminder letter will be sent to the patient
regarding the next appointment.

BI-RADS CATEGORY  1: Negative.

## 2022-11-01 ENCOUNTER — Other Ambulatory Visit: Payer: Self-pay | Admitting: Internal Medicine

## 2022-11-01 DIAGNOSIS — Z1231 Encounter for screening mammogram for malignant neoplasm of breast: Secondary | ICD-10-CM

## 2023-05-09 ENCOUNTER — Other Ambulatory Visit: Payer: Self-pay | Admitting: Internal Medicine

## 2023-05-09 DIAGNOSIS — Z1231 Encounter for screening mammogram for malignant neoplasm of breast: Secondary | ICD-10-CM
